# Patient Record
Sex: Male | Born: 1965 | Race: White | Hispanic: No | Marital: Married | Smoking: Never smoker
Health system: Southern US, Community
[De-identification: ages and names within clinical notes are randomized; demographics above are authoritative.]

## PROBLEM LIST (undated history)

## (undated) DIAGNOSIS — M109 Gout, unspecified: Secondary | ICD-10-CM

## (undated) HISTORY — DX: Gout, unspecified: M10.9

---

## 2003-02-26 ENCOUNTER — Emergency Department (HOSPITAL_COMMUNITY): Admission: EM | Admit: 2003-02-26 | Discharge: 2003-02-26 | Payer: Self-pay | Admitting: Emergency Medicine

## 2008-09-05 ENCOUNTER — Emergency Department (HOSPITAL_COMMUNITY): Admission: EM | Admit: 2008-09-05 | Discharge: 2008-09-05 | Payer: Self-pay | Admitting: Family Medicine

## 2013-05-01 ENCOUNTER — Encounter: Payer: Self-pay | Admitting: Family Medicine

## 2013-05-01 ENCOUNTER — Ambulatory Visit (INDEPENDENT_AMBULATORY_CARE_PROVIDER_SITE_OTHER): Payer: BC Managed Care – PPO | Admitting: Family Medicine

## 2013-05-01 VITALS — BP 141/88 | HR 71 | Temp 99.4°F | Ht 69.0 in | Wt 227.0 lb

## 2013-05-01 DIAGNOSIS — R3915 Urgency of urination: Secondary | ICD-10-CM

## 2013-05-01 DIAGNOSIS — R300 Dysuria: Secondary | ICD-10-CM

## 2013-05-01 DIAGNOSIS — R350 Frequency of micturition: Secondary | ICD-10-CM

## 2013-05-01 LAB — POCT URINALYSIS DIPSTICK
Bilirubin, UA: NEGATIVE
Blood, UA: NEGATIVE
Glucose, UA: NEGATIVE
Ketones, UA: NEGATIVE
Leukocytes, UA: NEGATIVE
Nitrite, UA: NEGATIVE
Protein, UA: NEGATIVE
Spec Grav, UA: 1.015
Urobilinogen, UA: NEGATIVE
pH, UA: 6

## 2013-05-01 MED ORDER — FLUCONAZOLE 150 MG PO TABS: 150.0000 mg | ORAL_TABLET | Freq: Once | ORAL | Status: DC

## 2013-05-01 NOTE — Patient Instructions (Addendum)
Urinary Tract Infection  Urinary tract infections (UTIs) can develop anywhere along your urinary tract. Your urinary tract is your body's drainage system for removing wastes and extra water. Your urinary tract includes two kidneys, two ureters, a bladder, and a urethra. Your kidneys are a pair of bean-shaped organs. Each kidney is about the size of your fist. They are located below your ribs, one on each side of your spine.  CAUSES  Infections are caused by microbes, which are microscopic organisms, including fungi, viruses, and bacteria. These organisms are so small that they can only be seen through a microscope. Bacteria are the microbes that most commonly cause UTIs.  SYMPTOMS   Symptoms of UTIs may vary by age and gender of the patient and by the location of the infection. Symptoms in young women typically include a frequent and intense urge to urinate and a painful, burning feeling in the bladder or urethra during urination. Older women and men are more likely to be tired, shaky, and weak and have muscle aches and abdominal pain. A fever may mean the infection is in your kidneys. Other symptoms of a kidney infection include pain in your back or sides below the ribs, nausea, and vomiting.  DIAGNOSIS  To diagnose a UTI, your caregiver will ask you about your symptoms. Your caregiver also will ask to provide a urine sample. The urine sample will be tested for bacteria and white blood cells. White blood cells are made by your body to help fight infection.  TREATMENT   Typically, UTIs can be treated with medication. Because most UTIs are caused by a bacterial infection, they usually can be treated with the use of antibiotics. The choice of antibiotic and length of treatment depend on your symptoms and the type of bacteria causing your infection.  HOME CARE INSTRUCTIONS   If you were prescribed antibiotics, take them exactly as your caregiver instructs you. Finish the medication even if you feel better after you  have only taken some of the medication.   Drink enough water and fluids to keep your urine clear or pale yellow.   Avoid caffeine, tea, and carbonated beverages. They tend to irritate your bladder.   Empty your bladder often. Avoid holding urine for long periods of time.   Empty your bladder before and after sexual intercourse.   After a bowel movement, women should cleanse from front to back. Use each tissue only once.  SEEK MEDICAL CARE IF:    You have back pain.   You develop a fever.   Your symptoms do not begin to resolve within 3 days.  SEEK IMMEDIATE MEDICAL CARE IF:    You have severe back pain or lower abdominal pain.   You develop chills.   You have nausea or vomiting.   You have continued burning or discomfort with urination.  MAKE SURE YOU:    Understand these instructions.   Will watch your condition.   Will get help right away if you are not doing well or get worse.  Document Released: 05/02/2005 Document Revised: 01/22/2012 Document Reviewed: 08/31/2011  [employer] Patient Information 2014 [employer].

## 2013-05-01 NOTE — Progress Notes (Signed)
  Subjective:    Patient ID: Christopher Gilmore, male    DOB: 1965-11-27, 47 y.o.   MRN: [id number]  HPI This 47 y.o. male presents for evaluation of urethritis for 2 days.  He states he has Had similar problem when he has sex with his wife and she gets a yeast infection And it transfers to him.  He has been having some dysuria.   Review of Systems No chest pain, SOB, HA, dizziness, vision change, N/V, diarrhea, constipation, dysuria, urinary urgency or frequency, myalgias, arthralgias or rash.     Objective:   Physical Exam Vital signs noted  Well developed well nourished male.  HEENT - Head atraumatic Normocephalic                Eyes - PERRLA, Conjuctiva - clear Sclera- Clear EOMI                Ears - EAC's Wnl TM's Wnl Gross Hearing WNL                Nose - Nares patent                 Throat - oropharanx wnl Respiratory - Lungs CTA bilateral Cardiac - RRR S1 and S2 without murmur GI - Abdomen soft Nontender and bowel sounds active x 4 GU - No balanitis seen and urethra appears wnl Extremities - No edema. Neuro - Grossly intact.  Results for orders placed in visit on 05/01/13  POCT URINALYSIS DIPSTICK      Result Value Range   Color, UA gold     Clarity, UA clear     Glucose, UA negative     Bilirubin, UA negative     Ketones, UA negative     Spec Grav, UA 1.015     Blood, UA negative     pH, UA 6.0     Protein, UA negative     Urobilinogen, UA negative     Nitrite, UA negative     Leukocytes, UA Negative         Assessment & Plan:  Urinary frequency - Plan: POCT urinalysis dipstick, POCT UA - Microscopic Only, Urine culture  Dysuria - Plan: POCT urinalysis dipstick, POCT UA - Microscopic Only, Urine culture Probably due to balanitis  Urgency of urination - Plan: POCT urinalysis dipstick, POCT UA - Microscopic Only, Urine culture  Followup prn if not better.

## 2013-05-05 ENCOUNTER — Other Ambulatory Visit: Payer: Self-pay | Admitting: Family Medicine

## 2013-05-05 DIAGNOSIS — N390 Urinary tract infection, site not specified: Secondary | ICD-10-CM

## 2013-05-05 LAB — URINE CULTURE

## 2013-05-05 MED ORDER — CIPROFLOXACIN HCL 500 MG PO TABS: 500.0000 mg | ORAL_TABLET | Freq: Two times a day (BID) | ORAL | Status: DC

## 2013-05-11 NOTE — Progress Notes (Signed)
Pt aware, going to pickup rx at [facility]

## 2013-08-04 ENCOUNTER — Encounter: Payer: Self-pay | Admitting: Family Medicine

## 2013-08-04 ENCOUNTER — Telehealth: Payer: Self-pay | Admitting: Family Medicine

## 2013-08-04 ENCOUNTER — Ambulatory Visit (INDEPENDENT_AMBULATORY_CARE_PROVIDER_SITE_OTHER): Payer: BC Managed Care – PPO | Admitting: Family Medicine

## 2013-08-04 VITALS — BP 146/87 | HR 96 | Temp 101.1°F | Ht 69.0 in | Wt 223.8 lb

## 2013-08-04 DIAGNOSIS — J111 Influenza due to unidentified influenza virus with other respiratory manifestations: Secondary | ICD-10-CM

## 2013-08-04 MED ORDER — OSELTAMIVIR PHOSPHATE 75 MG PO CAPS: 75.0000 mg | ORAL_CAPSULE | Freq: Two times a day (BID) | ORAL | Status: DC

## 2013-08-04 MED ORDER — HYDROCODONE-ACETAMINOPHEN 5-325 MG PO TABS: ORAL_TABLET | ORAL | Status: DC

## 2013-08-04 NOTE — Patient Instructions (Signed)

## 2013-08-04 NOTE — Telephone Encounter (Signed)
APPT TODAY AT 5:15

## 2013-08-04 NOTE — Progress Notes (Signed)
   Subjective:    Patient ID: Christopher Gilmore, male    DOB: August 26, 1965, 47 y.o.   MRN: [id number]  HPI This 47 y.o. male presents for evaluation of flu symptoms for a day.  He is having fever and  He is having pain in his back, legs, arms and chest muscles.  He feels very tired.  He is having High fever.   Review of Systems No chest pain, SOB, HA, dizziness, vision change, N/V, diarrhea, constipation, dysuria, urinary urgency or frequency, myalgias, arthralgias or rash.     Objective:   Physical Exam  Vital signs noted  Acutely ill appearing male.  HEENT - Head atraumatic Normocephalic                Eyes - PERRLA, Conjuctiva - clear Sclera- Clear EOMI                Ears - EAC's Wnl TM's Wnl Gross Hearing WNL                Throat - oropharanx wnl Respiratory - Lungs CTA bilateral Cardiac - RRR S1 and S2 without murmur GI - Abdomen soft Nontender and bowel sounds active x 4 Extremities - No edema. Neuro - Grossly intact.      Assessment & Plan:  Influenza - Plan: HYDROcodone-acetaminophen (NORCO) 5-325 MG per tablet, oseltamivir (TAMIFLU) 75 MG capsule, DISCONTINUED: oseltamivir (TAMIFLU) 75 MG capsule  Push po fluids, rest, tylenol and motrin otc prn as directed for fever, arthralgias, and myalgias.  Follow up prn if sx's continue or persist.  Deatra Canter FNP

## 2013-08-08 ENCOUNTER — Ambulatory Visit (INDEPENDENT_AMBULATORY_CARE_PROVIDER_SITE_OTHER): Payer: BC Managed Care – PPO | Admitting: Family Medicine

## 2013-08-08 ENCOUNTER — Encounter (INDEPENDENT_AMBULATORY_CARE_PROVIDER_SITE_OTHER): Payer: Self-pay

## 2013-08-08 ENCOUNTER — Encounter: Payer: Self-pay | Admitting: Family Medicine

## 2013-08-08 VITALS — BP 131/85 | HR 101 | Temp 102.8°F | Ht 69.0 in | Wt 221.0 lb

## 2013-08-08 DIAGNOSIS — N419 Inflammatory disease of prostate, unspecified: Secondary | ICD-10-CM

## 2013-08-08 DIAGNOSIS — R509 Fever, unspecified: Secondary | ICD-10-CM

## 2013-08-08 DIAGNOSIS — N390 Urinary tract infection, site not specified: Secondary | ICD-10-CM

## 2013-08-08 LAB — POCT URINALYSIS DIPSTICK
Bilirubin, UA: NEGATIVE
GLUCOSE UA: NEGATIVE
KETONES UA: NEGATIVE
Nitrite, UA: NEGATIVE
SPEC GRAV UA: 1.01
UROBILINOGEN UA: NEGATIVE
pH, UA: 5

## 2013-08-08 LAB — POCT UA - MICROSCOPIC ONLY
CASTS, UR, LPF, POC: NEGATIVE
CRYSTALS, UR, HPF, POC: NEGATIVE
Casts, Ur, LPF, POC: NEGATIVE
Crystals, Ur, HPF, POC: NEGATIVE
MUCUS UA: NEGATIVE
Mucus, UA: NEGATIVE
Yeast, UA: NEGATIVE
Yeast, UA: NEGATIVE

## 2013-08-08 LAB — POCT CBC
GRANULOCYTE PERCENT: 88.2 % — AB (ref 37–80)
HEMATOCRIT: 45.9 % (ref 43.5–53.7)
HEMOGLOBIN: 14.4 g/dL (ref 14.1–18.1)
Lymph, poc: 1.7 (ref 0.6–3.4)
MCH: 27.3 pg (ref 27–31.2)
MCHC: 31.5 g/dL — AB (ref 31.8–35.4)
MCV: 86.8 fL (ref 80–97)
MPV: 7.3 fL (ref 0–99.8)
PLATELET COUNT, POC: 220 10*3/uL (ref 142–424)
POC Granulocyte: 14 — AB (ref 2–6.9)
POC LYMPH PERCENT: 10.5 %L (ref 10–50)
RBC: 5.3 M/uL (ref 4.69–6.13)
RDW, POC: 12.3 %
WBC: 15.9 10*3/uL — AB (ref 4.6–10.2)

## 2013-08-08 MED ORDER — SULFAMETHOXAZOLE-TMP DS 800-160 MG PO TABS: 1.0000 | ORAL_TABLET | Freq: Two times a day (BID) | ORAL | Status: DC

## 2013-08-08 MED ORDER — IBUPROFEN 200 MG PO TABS
600.0000 mg | ORAL_TABLET | Freq: Once | ORAL | Status: AC
  Administered 2013-08-08: 600 mg via ORAL

## 2013-08-08 NOTE — Progress Notes (Signed)
Subjective:    Patient ID: Christopher Gilmore, male    DOB: October 03, 1965, 48 y.o.   MRN: [id number]  HPI 3 day history of dysuria and one week history of fever. Treated with Tamiflu last week. Denies any cough, congestion, or sore throat. Main complaint at previous visit was fever.    Review of Systems  Constitutional: Positive for fever, activity change and fatigue.  HENT: Negative.   Eyes: Negative.   Respiratory: Negative.   Cardiovascular: Negative.   Gastrointestinal: Negative.   Endocrine: Negative.   Genitourinary: Positive for dysuria, frequency and flank pain (left flank). Negative for testicular pain.  Musculoskeletal: Negative.   Skin: Negative.   Allergic/Immunologic: Negative.   Neurological: Negative.   Hematological: Negative.   Psychiatric/Behavioral: Negative.        Objective:   Physical Exam  Nursing note and vitals reviewed. Constitutional: He is oriented to person, place, and time. He appears well-developed and well-nourished. He appears distressed (just feeling bad from the fever).  HENT:  Head: Normocephalic and atraumatic.  Right Ear: External ear normal.  Left Ear: External ear normal.  Nose: Nose normal.  Mouth/Throat: Oropharynx is clear and moist. No oropharyngeal exudate.  Eyes: Conjunctivae and EOM are normal. Pupils are equal, round, and reactive to light. Right eye exhibits no discharge. Left eye exhibits no discharge. No scleral icterus.  Neck: Normal range of motion. Neck supple. No thyromegaly present.  Cardiovascular: Normal rate, regular rhythm, normal heart sounds and intact distal pulses.  Exam reveals no gallop and no friction rub.   No murmur heard. At 96 per minute  Pulmonary/Chest: Effort normal and breath sounds normal. No respiratory distress. He has no wheezes. He has no rales. He exhibits no tenderness.  Abdominal: Soft. Bowel sounds are normal. He exhibits no mass. There is no tenderness. There is no rebound and no guarding.    Minimal suprapubic and right lower quadrant  Genitourinary: Rectum normal and penis normal.  The prostate gland was slightly enlarged and tender to palpation. There was some discomfort with examining the prostate. The left testicle is in the scrotum but sits higher than the right testicle and this is normal for this patient. There was no super testicular masses and there was no hernia palpated on either side.  Musculoskeletal: Normal range of motion.  Lymphadenopathy:    He has no cervical adenopathy.  Neurological: He is alert and oriented to person, place, and time. He has normal reflexes.  Skin: Skin is warm and dry. No rash noted. No erythema. No pallor.  Psychiatric: He has a normal mood and affect. His behavior is normal. Judgment and thought content normal.    Results for orders placed in visit on 08/08/13  POCT CBC      Result Value Range   WBC 15.9 (*) 4.6 - 10.2 K/uL   Lymph, poc 1.7  0.6 - 3.4   POC LYMPH PERCENT 10.5  10 - 50 %L   POC Granulocyte 14.0 (*) 2 - 6.9   Granulocyte percent 88.2 (*) 37 - 80 %G   RBC 5.3  4.69 - 6.13 M/uL   Hemoglobin 14.4  14.1 - 18.1 g/dL   HCT, POC 21.3  08.6 - 53.7 %   MCV 86.8  80 - 97 fL   MCH, POC 27.3  27 - 31.2 pg   MCHC 31.5 (*) 31.8 - 35.4 g/dL   RDW, POC 57.8     Platelet Count, POC 220.0  142 - 424 K/uL  MPV 7.3  0 - 99.8 fL  POCT UA - MICROSCOPIC ONLY      Result Value Range   WBC, Ur, HPF, POC tntc     RBC, urine, microscopic 5-8     Bacteria, U Microscopic many     Mucus, UA negative     Epithelial cells, urine per micros occ     Crystals, Ur, HPF, POC negative     Casts, Ur, LPF, POC negative     Yeast, UA negative    POCT URINALYSIS DIPSTICK      Result Value Range   Color, UA gold     Clarity, UA cloudy     Glucose, UA negative     Bilirubin, UA negative     Ketones, UA negative     Spec Grav, UA 1.010     Blood, UA large     pH, UA 5.0     Protein, UA 4+     Urobilinogen, UA negative     Nitrite, UA  negative     Leukocytes, UA large (3+)           Assessment & Plan:  1. Fever, unspecified - POCT CBC - POCT UA - Microscopic Only - POCT urinalysis dipstick - Urine culture - ibuprofen (ADVIL,MOTRIN) tablet 600 mg; Take 3 tablets (600 mg total) by mouth once.  2. Urinary tract infection, site not specified - POCT CBC - POCT UA - Microscopic Only - POCT urinalysis dipstick - Urine culture - POCT UA - Microscopic Only; Future - POCT urinalysis dipstick; Future  3. Prostatitis Patient Instructions  Drink plenty of fluids Take Tylenol alternating with Advil for aches pains and fever Avoid milk cheese ice cream and dairy products and caffeine Take medication as directed If pain in the right lower quadrant gets more severe he should go to the emergency room for further evaluation Repeat urinalysis next Tuesday Remain out of work through Tuesday   Nyra Capeson W. Moore MD

## 2013-08-08 NOTE — Patient Instructions (Addendum)
Drink plenty of fluids Take Tylenol alternating with Advil for aches pains and fever Avoid milk cheese ice cream and dairy products and caffeine Take medication as directed If pain in the right lower quadrant gets more severe he should go to the emergency room for further evaluation Repeat urinalysis next Tuesday Remain out of work through Tuesday

## 2013-08-11 ENCOUNTER — Other Ambulatory Visit (INDEPENDENT_AMBULATORY_CARE_PROVIDER_SITE_OTHER): Payer: BC Managed Care – PPO

## 2013-08-11 DIAGNOSIS — N390 Urinary tract infection, site not specified: Secondary | ICD-10-CM

## 2013-08-11 LAB — POCT UA - MICROSCOPIC ONLY
Bacteria, U Microscopic: NEGATIVE
CRYSTALS, UR, HPF, POC: NEGATIVE
Casts, Ur, LPF, POC: NEGATIVE
Yeast, UA: NEGATIVE

## 2013-08-11 LAB — POCT URINALYSIS DIPSTICK
BILIRUBIN UA: NEGATIVE
Glucose, UA: NEGATIVE
KETONES UA: NEGATIVE
Nitrite, UA: NEGATIVE
PH UA: 5
PROTEIN UA: NEGATIVE
SPEC GRAV UA: 1.01
Urobilinogen, UA: NEGATIVE

## 2013-08-11 LAB — URINE CULTURE

## 2013-08-11 NOTE — Addendum Note (Signed)
Addended by: Prescott GumLAND, Junah Yam M on: 08/11/2013 11:29 AM   Modules accepted: Orders

## 2013-08-11 NOTE — Progress Notes (Signed)
Pt came in for labs only 

## 2013-08-12 ENCOUNTER — Telehealth: Payer: Self-pay | Admitting: Family Medicine

## 2013-08-12 LAB — URINE CULTURE: Organism ID, Bacteria: NO GROWTH

## 2013-08-25 NOTE — Telephone Encounter (Signed)
Called and left message for patient.

## 2013-08-26 NOTE — Telephone Encounter (Signed)
Done 08/12/13 by Asher Muirjamie b

## 2014-12-01 ENCOUNTER — Ambulatory Visit (INDEPENDENT_AMBULATORY_CARE_PROVIDER_SITE_OTHER): Payer: BLUE CROSS/BLUE SHIELD | Admitting: Family Medicine

## 2014-12-01 ENCOUNTER — Encounter: Payer: Self-pay | Admitting: Family Medicine

## 2014-12-01 VITALS — BP 137/86 | HR 66 | Temp 97.8°F | Ht 69.0 in | Wt 225.4 lb

## 2014-12-01 DIAGNOSIS — M25571 Pain in right ankle and joints of right foot: Secondary | ICD-10-CM

## 2014-12-01 MED ORDER — INDOMETHACIN 25 MG PO CAPS: 25.0000 mg | ORAL_CAPSULE | Freq: Three times a day (TID) | ORAL | Status: DC | PRN

## 2014-12-01 NOTE — Patient Instructions (Signed)
Due to your reluctance to use allopurinol and the infrequency of your symptoms let's use an observation status for the gout. If your symptoms become more frequent then we can consider allopurinol and/or the newer Uloric for gout prevention. In the meantime you can take the indomethacin up to 3 times a day with food to relieve pain.

## 2014-12-01 NOTE — Progress Notes (Signed)
Subjective:  Patient ID: JABARIE POP, male    DOB: [birth year]  Age: 49 y.o. MRN: [id number]  CC: Gout   HPI Linell C Kirchner presents for onset 3 days ago of moderately severe pain at the base of the right first toe. This has gone from about a 7-8/10 to a 2-3/10 today. He has no previous history of gout. He declines blood testing today to determine uric acid level and presence of gout.  History Majed has no past medical history on file.   He has no past surgical history on file.   His family history includes Cancer in his mother.He reports that he has never smoked. He does not have any smokeless tobacco history on file. He reports that he drinks alcohol. He reports that he does not use illicit drugs.  No current outpatient prescriptions on file prior to visit.   No current facility-administered medications on file prior to visit.    ROS Review of Systems  Constitutional: Negative for fever, chills, diaphoresis and unexpected weight change.  HENT: Negative for congestion, hearing loss, rhinorrhea, sore throat and trouble swallowing.   Respiratory: Negative for cough, chest tightness, shortness of breath and wheezing.   Gastrointestinal: Negative for nausea, vomiting, abdominal pain, diarrhea, constipation and abdominal distention.  Endocrine: Negative for cold intolerance and heat intolerance.  Genitourinary: Negative for dysuria, hematuria and flank pain.  Musculoskeletal: Positive for joint swelling and arthralgias (right first toe. See history of present illness none others noted).  Skin: Negative for rash.  Neurological: Negative for dizziness and headaches.  Psychiatric/Behavioral: Negative for dysphoric mood, decreased concentration and agitation. The patient is not nervous/anxious.     Objective:  BP 137/86 mmHg  Pulse 66  Temp(Src) 97.8 F (36.6 C) (Oral)  Ht  (1.753 m)  Wt 225 lb 6.4 oz (102.241 kg)  BMI 33.27 kg/m2  BP Readings from Last 3 Encounters:    12/01/14 137/86  08/08/13 131/85  08/04/13 146/87    Wt Readings from Last 3 Encounters:  12/01/14 225 lb 6.4 oz (102.241 kg)  08/08/13 221 lb (100.245 kg)  08/04/13 223 lb 12.8 oz (101.515 kg)     Physical Exam  Constitutional: He is oriented to person, place, and time. He appears well-developed and well-nourished. No distress.  HENT:  Head: Normocephalic and atraumatic.  Right Ear: External ear normal.  Left Ear: External ear normal.  Nose: Nose normal.  Mouth/Throat: Oropharynx is clear and moist.  Eyes: Conjunctivae and EOM are normal. Pupils are equal, round, and reactive to light.  Neck: Normal range of motion. Neck supple. No thyromegaly present.  Cardiovascular: Normal rate, regular rhythm and normal heart sounds.   No murmur heard. Pulmonary/Chest: Effort normal and breath sounds normal. No respiratory distress. He has no wheezes. He has no rales.  Abdominal: Soft. Bowel sounds are normal. He exhibits no distension. There is tenderness (right first MTP jointMild-to-moderate).  Lymphadenopathy:    He has no cervical adenopathy.  Neurological: He is alert and oriented to person, place, and time. He has normal reflexes.  Skin: Skin is warm and dry.  Psychiatric: He has a normal mood and affect. His behavior is normal. Judgment and thought content normal.    No results found for: HGBA1C  Lab Results  Component Value Date   WBC 15.9* 08/08/2013   HGB 14.4 08/08/2013   HCT 45.9 08/08/2013    Dg Ankle Complete Right  09/05/2008   Clinical Data: 49 year old male fall, ankle pain and swelling  RIGHT ANKLE - COMPLETE 3+ VIEW   Comparison: None.   Findings: The right distal fibula shaft demonstrate an acute oblique fracture with minimal displacement.  At the ankle, there is diffuse soft tissue swelling.  Medially, there is widening of the joint space with small avulsion fractures noted of the medial malleolus.  On the lateral view, a subtle nondisplaced fracture is  suspected of the posterior malleolus.  Talus and calcaneus appear intact.   IMPRESSION: Acute displaced right distal fibula shaft fracture. Small avulsion fractures of the medial malleolus with widening of the ankle joint space Possible nondisplaced fracture of the posterior malleolus on the lateral view.  Provider: Lianne MorisJennifer Lentz   Assessment & Plan:   Regino BellowLoman was seen today for gout.  Diagnoses and all orders for this visit:  Arthralgia of toe of right foot Orders: -     Uric acid -     POCT CBC  Other orders -     indomethacin (INDOCIN) 25 MG capsule; Take 1 capsule (25 mg total) by mouth 3 (three) times daily with meals as needed. For foot pain   I have discontinued Mr. Jaquita FoldsHanes's HYDROcodone-acetaminophen, oseltamivir, and sulfamethoxazole-trimethoprim. I am also having him start on indomethacin.  Meds ordered this encounter  Medications  . indomethacin (INDOCIN) 25 MG capsule    Sig: Take 1 capsule (25 mg total) by mouth 3 (three) times daily with meals as needed. For foot pain    Dispense:  30 capsule    Refill:  2   Due to your reluctance to use allopurinol and the infrequency of your symptoms let's use an observation status for the gout. If your symptoms become more frequent then we can consider allopurinol and/or the newer Uloric for gout prevention. In the meantime you can take the indomethacin up to 3 times a day with food to relieve pain.  Follow-up: Return if symptoms worsen or fail to improve, for CPE recommended.  Mechele ClaudeWarren Charlita Brian, M.D.

## 2015-11-20 DIAGNOSIS — I10 Essential (primary) hypertension: Secondary | ICD-10-CM | POA: Diagnosis not present

## 2015-11-21 ENCOUNTER — Ambulatory Visit (INDEPENDENT_AMBULATORY_CARE_PROVIDER_SITE_OTHER): Payer: BLUE CROSS/BLUE SHIELD | Admitting: Family Medicine

## 2015-11-21 ENCOUNTER — Encounter: Payer: Self-pay | Admitting: Family Medicine

## 2015-11-21 VITALS — BP 157/97 | HR 63 | Temp 98.0°F | Ht 69.0 in | Wt 227.4 lb

## 2015-11-21 DIAGNOSIS — M109 Gout, unspecified: Secondary | ICD-10-CM

## 2015-11-21 DIAGNOSIS — IMO0001 Reserved for inherently not codable concepts without codable children: Secondary | ICD-10-CM

## 2015-11-21 DIAGNOSIS — R030 Elevated blood-pressure reading, without diagnosis of hypertension: Secondary | ICD-10-CM | POA: Diagnosis not present

## 2015-11-21 DIAGNOSIS — M10072 Idiopathic gout, left ankle and foot: Secondary | ICD-10-CM | POA: Diagnosis not present

## 2015-11-21 MED ORDER — PREDNISONE 20 MG PO TABS
ORAL_TABLET | ORAL | Status: DC
Start: 2015-11-21 — End: 2016-05-24

## 2015-11-21 NOTE — Progress Notes (Signed)
BP 157/97 mmHg  Pulse 63  Temp(Src) 98 F (36.7 C) (Oral)  Ht  (1.753 m)  Wt 227 lb 6.4 oz (103.148 kg)  BMI 33.57 kg/m2   Subjective:    Patient ID: Christopher Gilmore, male    DOB: 1966/03/22, 50 y.o.   MRN: [id number]  HPI: Christopher Gilmore is a 50 y.o. male presenting on 11/21/2015 for Left foot pain and Hypertension   HPI Left foot pain Patient has had left foot pain and swelling for the past 4 days. Yesterday he went into an urgent care and was seen there because of the pain and how it had increased. He had been taking some leftover Indocin that he had from previous. He does get known gout flares and that is the location that he always gets it in. The pain is worse around the MTP joint of the second and third toe. He denies any fevers or chills. There has been swelling and some redness around the toe but those have greatly reduced today.  Hypertension Patient's blood pressure is elevated today and was elevated yesterday in the urgent care that he went to. He is in pain from both of those occasions and he says it has not been elevated as much previously. Patient denies headaches, blurred vision, chest pains, shortness of breath, or weakness. He is not currently on any medications for blood pressure.   Relevant past medical, surgical, family and social history reviewed and updated as indicated. Interim medical history since our last visit reviewed. Allergies and medications reviewed and updated.  Review of Systems  Constitutional: Negative for fever.  HENT: Negative for ear discharge and ear pain.   Eyes: Negative for discharge and visual disturbance.  Respiratory: Negative for shortness of breath and wheezing.   Cardiovascular: Negative for chest pain and leg swelling.  Gastrointestinal: Negative for abdominal pain, diarrhea and constipation.  Genitourinary: Negative for difficulty urinating.  Musculoskeletal: Positive for joint swelling and arthralgias. Negative for back pain  and gait problem.  Skin: Positive for color change. Negative for rash.  Neurological: Negative for syncope, light-headedness and headaches.  All other systems reviewed and are negative.   Per HPI unless specifically indicated above     Medication List       This list is accurate as of: 11/21/15  4:47 PM.  Always use your most recent med list.               indomethacin 25 MG capsule  Commonly known as:  INDOCIN  Take 1 capsule (25 mg total) by mouth 3 (three) times daily with meals as needed. For foot pain     predniSONE 20 MG tablet  Commonly known as:  DELTASONE  2 po at same time daily for 5 days           Objective:    BP 157/97 mmHg  Pulse 63  Temp(Src) 98 F (36.7 C) (Oral)  Ht  (1.753 m)  Wt 227 lb 6.4 oz (103.148 kg)  BMI 33.57 kg/m2  Wt Readings from Last 3 Encounters:  11/21/15 227 lb 6.4 oz (103.148 kg)  12/01/14 225 lb 6.4 oz (102.241 kg)  08/08/13 221 lb (100.245 kg)    Physical Exam  Constitutional: He is oriented to person, place, and time. He appears well-developed and well-nourished. No distress.  Eyes: Conjunctivae and EOM are normal. Pupils are equal, round, and reactive to light. Right eye exhibits no discharge. No scleral icterus.  Cardiovascular: Normal rate,  regular rhythm, normal heart sounds and intact distal pulses.   No murmur heard. Pulmonary/Chest: Effort normal and breath sounds normal. No respiratory distress. He has no wheezes.  Musculoskeletal: Normal range of motion. He exhibits no edema.  Neurological: He is alert and oriented to person, place, and time. Coordination normal.  Skin: Skin is warm and dry. No rash noted. He is not diaphoretic.  Psychiatric: He has a normal mood and affect. His behavior is normal.  Nursing note and vitals reviewed.   Results for orders placed or performed in visit on 08/11/13  Urine culture  Result Value Ref Range   Urine Culture, Routine Final report    Urine Culture result 1 No growth    POCT UA - Microscopic Only  Result Value Ref Range   WBC, Ur, HPF, POC 15-20    RBC, urine, microscopic 1-5    Bacteria, U Microscopic neg    Mucus, UA occ    Epithelial cells, urine per micros occ    Crystals, Ur, HPF, POC neg    Casts, Ur, LPF, POC neg    Yeast, UA neg   POCT urinalysis dipstick  Result Value Ref Range   Color, UA yellow    Clarity, UA clear    Glucose, UA neg    Bilirubin, UA neg    Ketones, UA neg    Spec Grav, UA 1.010    Blood, UA trace    pH, UA 5.0    Protein, UA neg    Urobilinogen, UA negative    Nitrite, UA neg    Leukocytes, UA moderate (2+)       Assessment & Plan:   Problem List Items Addressed This Visit    None    Visit Diagnoses    Acute gout of left foot, unspecified cause    -  Primary    Relevant Medications    predniSONE (DELTASONE) 20 MG tablet    Elevated blood pressure        We will have him check it at home and come back in a month with those numbers and recheck here in the office once his pain is gone.        Follow up plan: Return in about 4 weeks (around 12/19/2015), or if symptoms worsen or fail to improve, for Recheck blood pressure and physical and fasting labs.  Counseling provided for all of the vaccine components No orders of the defined types were placed in this encounter.    Arville CareJoshua Dettinger, MD [facility] 11/21/2015, 4:47 PM

## 2015-11-25 ENCOUNTER — Telehealth: Payer: Self-pay | Admitting: Family Medicine

## 2015-11-25 MED ORDER — COLCHICINE 0.6 MG PO TABS: 0.6000 mg | ORAL_TABLET | Freq: Every day | ORAL | Status: DC

## 2015-11-25 NOTE — Telephone Encounter (Signed)
Sent colchicine for him to try for his gout, may continue taking Indocin or ibuprofen with the colchicine

## 2015-12-02 NOTE — Telephone Encounter (Signed)
Several attempts have been made to contact patient this encounter will be closed.  

## 2015-12-19 ENCOUNTER — Ambulatory Visit: Payer: BLUE CROSS/BLUE SHIELD | Admitting: Family Medicine

## 2015-12-28 ENCOUNTER — Ambulatory Visit: Payer: BLUE CROSS/BLUE SHIELD | Admitting: Family Medicine

## 2016-01-05 ENCOUNTER — Ambulatory Visit: Payer: BLUE CROSS/BLUE SHIELD | Admitting: Family Medicine

## 2016-01-11 ENCOUNTER — Ambulatory Visit: Payer: BLUE CROSS/BLUE SHIELD | Admitting: Family Medicine

## 2016-05-24 ENCOUNTER — Encounter: Payer: Self-pay | Admitting: Family Medicine

## 2016-05-24 ENCOUNTER — Ambulatory Visit (INDEPENDENT_AMBULATORY_CARE_PROVIDER_SITE_OTHER): Payer: BLUE CROSS/BLUE SHIELD | Admitting: Family Medicine

## 2016-05-24 VITALS — BP 154/96 | HR 64 | Temp 97.3°F | Ht 69.0 in | Wt 228.0 lb

## 2016-05-24 DIAGNOSIS — S39013A Strain of muscle, fascia and tendon of pelvis, initial encounter: Secondary | ICD-10-CM | POA: Diagnosis not present

## 2016-05-24 DIAGNOSIS — R030 Elevated blood-pressure reading, without diagnosis of hypertension: Secondary | ICD-10-CM

## 2016-05-24 DIAGNOSIS — S39011A Strain of muscle, fascia and tendon of abdomen, initial encounter: Secondary | ICD-10-CM

## 2016-05-24 NOTE — Progress Notes (Signed)
BP (!) 156/94   Pulse 73   Temp 97.3 F (36.3 C) (Oral)   Ht 5\' 9"  (1.753 m)   Wt 228 lb (103.4 kg)   BMI 33.67 kg/m    Subjective:    Patient ID: Christopher FallsLoman C Bublitz, male    DOB: 05/11/1966, 50 y.o.   MRN: [id number]  HPI: Christopher Gilmore is a 50 y.o. 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 He admits that he has been more stressed this week and then having a lot of more responsibilities at work and family difficulties with some of his children in their changes. He does not know if that's why his blood pressure is up today. Patient denies headaches, blurred vision, chest pains, shortness of breath, or weakness.   Groin pain  Patient has right inner groin pain. He said it started about a month ago when he twisted the wrong way and pulled a muscle in his groin. He says it was getting better after that until 4 days ago when he was getting off his lawnmower and he twisted in such a way that he started having the pains again. Since he was coming in today for the blood pressure he just wanted to have it checked to make sure that all was. He denies any numbness or weakness or difficulty ambulating. He denies any constipation or diarrhea or bulges in his groin. He denies any overlying skin changes. It mainly hurts on the inner aspect of his right upper thigh and does not radiate anywhere else. It hurts more when he steps in a certain way or twists in a certain direction.  Relevant past medical, surgical, family and social history reviewed and updated as indicated. Interim medical history since our last visit reviewed. Allergies and medications reviewed and  updated.  Review of Systems  Constitutional: Negative for chills and fever.  Eyes: Negative for discharge.  Respiratory: Negative for shortness of breath and wheezing.   Cardiovascular: Negative for chest pain and leg swelling.  Musculoskeletal: Positive for myalgias. Negative for back pain and gait problem.  Skin: Negative for rash.  Neurological: Negative for dizziness, speech difficulty, weakness, light-headedness, numbness and headaches.  All other systems reviewed and are negative.   Per HPI unless specifically indicated above     Medication List    as of 05/24/2016  1:53 PM   You have not been prescribed any medications.        Objective:    BP (!) 156/94   Pulse 73   Temp 97.3 F (36.3 C) (Oral)   Ht 5\' 9"  (1.753 m)   Wt 228 lb (103.4 kg)   BMI 33.67 kg/m   Wt Readings from Last 3 Encounters:  05/24/16 228 lb (103.4 kg)  11/21/15 227 lb 6.4 oz (103.1 kg)  12/01/14 225 lb 6.4 oz (102.2 kg)    Physical Exam  Constitutional: He is oriented to person, place, and time. He appears well-developed and well-nourished. No distress.  Eyes: Conjunctivae are normal. Right eye exhibits no discharge. No scleral icterus.  Cardiovascular: Normal rate, regular rhythm, normal heart sounds and intact distal pulses.  No murmur heard. Pulmonary/Chest: Effort normal and breath sounds normal. No respiratory distress. He has no wheezes.  Musculoskeletal: Normal range of motion. He exhibits tenderness (Tenderness on right inner upper thigh. Worse with external rotation and abduction.). He exhibits no edema.       Right hip: He exhibits tenderness. He exhibits normal range of motion, normal strength, no bony tenderness and no swelling.  Neurological: He is alert and oriented to person, place, and time. Coordination normal.  Skin: Skin is warm and dry. No rash noted. He is not diaphoretic.  Psychiatric: He has a normal mood and affect. His behavior is normal.  Nursing note and vitals  reviewed.     Assessment & Plan:   Problem List Items Addressed This Visit    None    Visit Diagnoses    Elevated blood pressure reading    -  Primary   Monitor for this month and have him come back in one month and see where it sat.   Strain of muscle of right groin region       Stretches heating pads and anti-inflammatories are recommended.       Follow up plan: Return in about 4 weeks (around 06/21/2016), or if symptoms worsen or fail to improve, for Blood pressure recheck.  Counseling provided for all of the vaccine components No orders of the defined types were placed in this encounter.   Arville Care, MD [facility] 05/24/2016, 1:53 PM

## 2016-06-25 DIAGNOSIS — I10 Essential (primary) hypertension: Secondary | ICD-10-CM | POA: Diagnosis not present

## 2016-07-23 ENCOUNTER — Other Ambulatory Visit: Payer: Self-pay | Admitting: *Deleted

## 2016-07-23 MED ORDER — COLCHICINE 0.6 MG PO TABS: 0.6000 mg | ORAL_TABLET | Freq: Every day | ORAL | 2 refills | Status: DC

## 2016-07-23 NOTE — Telephone Encounter (Signed)
Allopurinol is not something you take when you have a flare, it can actually make the flare worse if he starts it during a flare. If he has been on it that it is okay to continue taking it during the flare. In that case we can go ahead and send a refill but it is not something that you just take during flares. Recommend high-dose anti-inflammatories such as Aleve or ibuprofen as long as his kidneys are in good shape

## 2016-07-23 NOTE — Telephone Encounter (Signed)
Pt is having a gout flare up. Can allupurinol be called in.

## 2016-07-23 NOTE — Telephone Encounter (Signed)
Pt aware colchicine Rx sent to pharmacy. Also made pt aware that he can take the allupurinol as a preventative on a regular basis after the colchicine

## 2016-07-23 NOTE — Telephone Encounter (Signed)
Pt actually has taken colchicine in the past. He has done the aleve & ibuprofen with no relief, has issues walking when his foot is so swollen

## 2016-08-20 DIAGNOSIS — I10 Essential (primary) hypertension: Secondary | ICD-10-CM | POA: Diagnosis not present

## 2016-08-20 DIAGNOSIS — E669 Obesity, unspecified: Secondary | ICD-10-CM | POA: Diagnosis not present

## 2016-08-20 DIAGNOSIS — Z713 Dietary counseling and surveillance: Secondary | ICD-10-CM | POA: Diagnosis not present

## 2016-09-13 ENCOUNTER — Other Ambulatory Visit: Payer: Self-pay | Admitting: *Deleted

## 2016-09-13 MED ORDER — ALLOPURINOL 100 MG PO TABS: 100.0000 mg | ORAL_TABLET | Freq: Every day | ORAL | 6 refills | Status: DC

## 2016-09-13 NOTE — Telephone Encounter (Signed)
Patient aware of recommendation.  

## 2016-10-15 DIAGNOSIS — E669 Obesity, unspecified: Secondary | ICD-10-CM | POA: Diagnosis not present

## 2016-10-15 DIAGNOSIS — Z713 Dietary counseling and surveillance: Secondary | ICD-10-CM | POA: Diagnosis not present

## 2016-10-15 DIAGNOSIS — I10 Essential (primary) hypertension: Secondary | ICD-10-CM | POA: Diagnosis not present

## 2016-10-29 ENCOUNTER — Ambulatory Visit (INDEPENDENT_AMBULATORY_CARE_PROVIDER_SITE_OTHER): Payer: BLUE CROSS/BLUE SHIELD | Admitting: Family Medicine

## 2016-10-29 ENCOUNTER — Encounter: Payer: Self-pay | Admitting: Family Medicine

## 2016-10-29 DIAGNOSIS — E669 Obesity, unspecified: Secondary | ICD-10-CM | POA: Diagnosis not present

## 2016-10-29 DIAGNOSIS — R7301 Impaired fasting glucose: Secondary | ICD-10-CM | POA: Diagnosis not present

## 2016-10-29 DIAGNOSIS — I10 Essential (primary) hypertension: Secondary | ICD-10-CM

## 2016-10-29 DIAGNOSIS — Z719 Counseling, unspecified: Secondary | ICD-10-CM | POA: Diagnosis not present

## 2016-10-29 DIAGNOSIS — Z6833 Body mass index (BMI) 33.0-33.9, adult: Secondary | ICD-10-CM | POA: Diagnosis not present

## 2016-10-29 DIAGNOSIS — Z008 Encounter for other general examination: Secondary | ICD-10-CM | POA: Diagnosis not present

## 2016-10-29 DIAGNOSIS — E782 Mixed hyperlipidemia: Secondary | ICD-10-CM | POA: Diagnosis not present

## 2016-10-29 DIAGNOSIS — J309 Allergic rhinitis, unspecified: Secondary | ICD-10-CM | POA: Diagnosis not present

## 2016-10-29 MED ORDER — LISINOPRIL 20 MG PO TABS: 20.0000 mg | ORAL_TABLET | Freq: Every day | ORAL | 1 refills | Status: DC

## 2016-10-29 NOTE — Progress Notes (Signed)
   BP (!) 147/98   Pulse 69   Temp 98 F (36.7 C) (Oral)   Ht _0  (1.753 m)   Wt 231 lb (104.8 kg)   BMI 34.11 kg/m    Subjective:    Patient ID: Christopher Gilmore, male    DOB: 06/05/1966, 51 y.o.   MRN: [id number]  HPI: Christopher Gilmore is a 51 y.o. male presenting on 10/29/2016 for Hypertension (BP has been elevated, has a lot of stress at work, worrying about teenage son)   HPI Hypertension Patient is concerned because his blood pressure has been elevated a lot recently. His blood pressure today is 147/98 in the last visit as it was around the same. His blood pressures is been getting at work have been in the 140s up to the 160s. He is not currently on any medication. Patient denies headaches, blurred vision, chest pains, shortness of breath, or weakness.    Relevant past medical, surgical, family and social history reviewed and updated as indicated. Interim medical history since our last visit reviewed. Allergies and medications reviewed and updated.  Review of Systems  Constitutional: Negative for chills and fever.  Respiratory: Negative for shortness of breath and wheezing.   Cardiovascular: Negative for chest pain and leg swelling.  Musculoskeletal: Negative for back pain and gait problem.  Skin: Negative for rash.  Neurological: Negative for dizziness, weakness and headaches.  All other systems reviewed and are negative.   Per HPI unless specifically indicated above     Objective:    BP (!) 147/98   Pulse 69   Temp 98 F (36.7 C) (Oral)   Ht _1  (1.753 m)   Wt 231 lb (104.8 kg)   BMI 34.11 kg/m   Wt Readings from Last 3 Encounters:  10/29/16 231 lb (104.8 kg)  05/24/16 228 lb (103.4 kg)  11/21/15 227 lb 6.4 oz (103.1 kg)    Physical Exam  Constitutional: He is oriented to person, place, and time. He appears well-developed and well-nourished. No distress.  Eyes: Conjunctivae are normal. No scleral icterus.  Neck: Neck supple.  Cardiovascular: Normal rate,  regular rhythm, normal heart sounds and intact distal pulses.   No murmur heard. Pulmonary/Chest: Effort normal and breath sounds normal. No respiratory distress. He has no wheezes.  Musculoskeletal: Normal range of motion. He exhibits no edema.  Lymphadenopathy:    He has no cervical adenopathy.  Neurological: He is alert and oriented to person, place, and time. Coordination normal.  Skin: Skin is warm and dry. No rash noted. He is not diaphoretic.  Psychiatric: He has a normal mood and affect. His behavior is normal.  Nursing note and vitals reviewed.     Assessment & Plan:   Problem List Items Addressed This Visit      Cardiovascular and Mediastinum   Hypertension   Relevant Medications   lisinopril (PRINIVIL,ZESTRIL) 20 MG tablet   Other Relevant Orders   CMP14+EGFR   Lipid panel       Follow up plan: Return in about 4 weeks (around 11/26/2016), or if symptoms worsen or fail to improve, for Hypertension recheck.  Counseling provided for all of the vaccine components Orders Placed This Encounter  Procedures  . CMP14+EGFR  . Lipid panel    Caryl Pina, MD [facility] 10/29/2016, 4:50 PM

## 2016-11-12 DIAGNOSIS — Z6832 Body mass index (BMI) 32.0-32.9, adult: Secondary | ICD-10-CM | POA: Diagnosis not present

## 2016-11-12 DIAGNOSIS — F17220 Nicotine dependence, chewing tobacco, uncomplicated: Secondary | ICD-10-CM | POA: Diagnosis not present

## 2016-11-12 DIAGNOSIS — Z719 Counseling, unspecified: Secondary | ICD-10-CM | POA: Diagnosis not present

## 2016-11-12 DIAGNOSIS — E782 Mixed hyperlipidemia: Secondary | ICD-10-CM | POA: Diagnosis not present

## 2016-11-12 DIAGNOSIS — I10 Essential (primary) hypertension: Secondary | ICD-10-CM | POA: Diagnosis not present

## 2016-11-12 DIAGNOSIS — Z008 Encounter for other general examination: Secondary | ICD-10-CM | POA: Diagnosis not present

## 2016-11-12 DIAGNOSIS — E669 Obesity, unspecified: Secondary | ICD-10-CM | POA: Diagnosis not present

## 2016-11-12 DIAGNOSIS — J309 Allergic rhinitis, unspecified: Secondary | ICD-10-CM | POA: Diagnosis not present

## 2016-11-28 ENCOUNTER — Other Ambulatory Visit: Payer: Self-pay | Admitting: Family Medicine

## 2016-11-28 DIAGNOSIS — I10 Essential (primary) hypertension: Secondary | ICD-10-CM

## 2016-12-17 DIAGNOSIS — I10 Essential (primary) hypertension: Secondary | ICD-10-CM | POA: Diagnosis not present

## 2016-12-17 DIAGNOSIS — Z713 Dietary counseling and surveillance: Secondary | ICD-10-CM | POA: Diagnosis not present

## 2016-12-17 DIAGNOSIS — E669 Obesity, unspecified: Secondary | ICD-10-CM | POA: Diagnosis not present

## 2017-02-20 DIAGNOSIS — J309 Allergic rhinitis, unspecified: Secondary | ICD-10-CM | POA: Diagnosis not present

## 2017-02-20 DIAGNOSIS — E782 Mixed hyperlipidemia: Secondary | ICD-10-CM | POA: Diagnosis not present

## 2017-02-20 DIAGNOSIS — F17220 Nicotine dependence, chewing tobacco, uncomplicated: Secondary | ICD-10-CM | POA: Diagnosis not present

## 2017-02-20 DIAGNOSIS — I10 Essential (primary) hypertension: Secondary | ICD-10-CM | POA: Diagnosis not present

## 2017-02-20 DIAGNOSIS — Z008 Encounter for other general examination: Secondary | ICD-10-CM | POA: Diagnosis not present

## 2017-02-20 DIAGNOSIS — E669 Obesity, unspecified: Secondary | ICD-10-CM | POA: Diagnosis not present

## 2017-02-20 DIAGNOSIS — Z6832 Body mass index (BMI) 32.0-32.9, adult: Secondary | ICD-10-CM | POA: Diagnosis not present

## 2017-02-20 DIAGNOSIS — Z716 Tobacco abuse counseling: Secondary | ICD-10-CM | POA: Diagnosis not present

## 2017-02-20 DIAGNOSIS — R7301 Impaired fasting glucose: Secondary | ICD-10-CM | POA: Diagnosis not present

## 2017-03-09 ENCOUNTER — Telehealth: Payer: BLUE CROSS/BLUE SHIELD | Admitting: Nurse Practitioner

## 2017-03-09 DIAGNOSIS — N3000 Acute cystitis without hematuria: Secondary | ICD-10-CM

## 2017-03-09 MED ORDER — CIPROFLOXACIN HCL 500 MG PO TABS: 500.0000 mg | ORAL_TABLET | Freq: Two times a day (BID) | ORAL | 0 refills | Status: DC

## 2017-03-09 NOTE — Progress Notes (Signed)

## 2017-05-20 DIAGNOSIS — I10 Essential (primary) hypertension: Secondary | ICD-10-CM | POA: Diagnosis not present

## 2017-05-20 DIAGNOSIS — Z716 Tobacco abuse counseling: Secondary | ICD-10-CM | POA: Diagnosis not present

## 2017-05-20 DIAGNOSIS — R7301 Impaired fasting glucose: Secondary | ICD-10-CM | POA: Diagnosis not present

## 2017-05-20 DIAGNOSIS — F17220 Nicotine dependence, chewing tobacco, uncomplicated: Secondary | ICD-10-CM | POA: Diagnosis not present

## 2017-05-20 DIAGNOSIS — Z008 Encounter for other general examination: Secondary | ICD-10-CM | POA: Diagnosis not present

## 2017-05-20 DIAGNOSIS — E782 Mixed hyperlipidemia: Secondary | ICD-10-CM | POA: Diagnosis not present

## 2017-05-20 DIAGNOSIS — Z719 Counseling, unspecified: Secondary | ICD-10-CM | POA: Diagnosis not present

## 2017-05-20 DIAGNOSIS — J309 Allergic rhinitis, unspecified: Secondary | ICD-10-CM | POA: Diagnosis not present

## 2017-05-20 DIAGNOSIS — E669 Obesity, unspecified: Secondary | ICD-10-CM | POA: Diagnosis not present

## 2017-05-27 DIAGNOSIS — I10 Essential (primary) hypertension: Secondary | ICD-10-CM | POA: Diagnosis not present

## 2017-06-12 DIAGNOSIS — Z79899 Other long term (current) drug therapy: Secondary | ICD-10-CM | POA: Diagnosis not present

## 2017-06-12 DIAGNOSIS — I10 Essential (primary) hypertension: Secondary | ICD-10-CM | POA: Diagnosis not present

## 2017-06-26 DIAGNOSIS — R945 Abnormal results of liver function studies: Secondary | ICD-10-CM | POA: Diagnosis not present

## 2017-06-26 DIAGNOSIS — I10 Essential (primary) hypertension: Secondary | ICD-10-CM | POA: Diagnosis not present

## 2017-06-26 DIAGNOSIS — J0100 Acute maxillary sinusitis, unspecified: Secondary | ICD-10-CM | POA: Diagnosis not present

## 2017-07-03 DIAGNOSIS — J0101 Acute recurrent maxillary sinusitis: Secondary | ICD-10-CM | POA: Diagnosis not present

## 2017-10-14 DIAGNOSIS — F17220 Nicotine dependence, chewing tobacco, uncomplicated: Secondary | ICD-10-CM | POA: Diagnosis not present

## 2017-10-14 DIAGNOSIS — E782 Mixed hyperlipidemia: Secondary | ICD-10-CM | POA: Diagnosis not present

## 2017-10-14 DIAGNOSIS — Z008 Encounter for other general examination: Secondary | ICD-10-CM | POA: Diagnosis not present

## 2017-10-14 DIAGNOSIS — E669 Obesity, unspecified: Secondary | ICD-10-CM | POA: Diagnosis not present

## 2017-10-14 DIAGNOSIS — Z719 Counseling, unspecified: Secondary | ICD-10-CM | POA: Diagnosis not present

## 2017-10-14 DIAGNOSIS — Z716 Tobacco abuse counseling: Secondary | ICD-10-CM | POA: Diagnosis not present

## 2017-10-14 DIAGNOSIS — R7301 Impaired fasting glucose: Secondary | ICD-10-CM | POA: Diagnosis not present

## 2018-01-08 DIAGNOSIS — Z719 Counseling, unspecified: Secondary | ICD-10-CM | POA: Diagnosis not present

## 2018-01-08 DIAGNOSIS — R7301 Impaired fasting glucose: Secondary | ICD-10-CM | POA: Diagnosis not present

## 2018-01-08 DIAGNOSIS — Z716 Tobacco abuse counseling: Secondary | ICD-10-CM | POA: Diagnosis not present

## 2018-01-08 DIAGNOSIS — Z008 Encounter for other general examination: Secondary | ICD-10-CM | POA: Diagnosis not present

## 2018-01-08 DIAGNOSIS — E669 Obesity, unspecified: Secondary | ICD-10-CM | POA: Diagnosis not present

## 2018-01-08 DIAGNOSIS — F17220 Nicotine dependence, chewing tobacco, uncomplicated: Secondary | ICD-10-CM | POA: Diagnosis not present

## 2018-01-17 ENCOUNTER — Ambulatory Visit: Payer: BLUE CROSS/BLUE SHIELD | Admitting: Family Medicine

## 2018-04-17 ENCOUNTER — Ambulatory Visit: Payer: BLUE CROSS/BLUE SHIELD | Admitting: Family Medicine

## 2018-04-17 ENCOUNTER — Encounter: Payer: Self-pay | Admitting: Family Medicine

## 2018-04-17 VITALS — BP 142/88 | HR 71 | Temp 98.1°F | Ht 69.0 in | Wt 234.8 lb

## 2018-04-17 DIAGNOSIS — R319 Hematuria, unspecified: Secondary | ICD-10-CM

## 2018-04-17 DIAGNOSIS — N3001 Acute cystitis with hematuria: Secondary | ICD-10-CM | POA: Diagnosis not present

## 2018-04-17 LAB — URINALYSIS, COMPLETE
Bilirubin, UA: NEGATIVE
GLUCOSE, UA: NEGATIVE
Ketones, UA: NEGATIVE
LEUKOCYTES UA: NEGATIVE
Nitrite, UA: NEGATIVE
PROTEIN UA: NEGATIVE
Specific Gravity, UA: 1.01 (ref 1.005–1.030)
UUROB: 0.2 mg/dL (ref 0.2–1.0)
pH, UA: 6 (ref 5.0–7.5)

## 2018-04-17 LAB — CMP14+EGFR
A/G RATIO: 1.9 (ref 1.2–2.2)
ALBUMIN: 4.8 g/dL (ref 3.5–5.5)
ALT: 93 IU/L — ABNORMAL HIGH (ref 0–44)
AST: 121 IU/L — ABNORMAL HIGH (ref 0–40)
Alkaline Phosphatase: 38 IU/L — ABNORMAL LOW (ref 39–117)
BILIRUBIN TOTAL: 0.4 mg/dL (ref 0.0–1.2)
BUN / CREAT RATIO: 11 (ref 9–20)
BUN: 15 mg/dL (ref 6–24)
CO2: 22 mmol/L (ref 20–29)
Calcium: 9.7 mg/dL (ref 8.7–10.2)
Chloride: 100 mmol/L (ref 96–106)
Creatinine, Ser: 1.33 mg/dL — ABNORMAL HIGH (ref 0.76–1.27)
GFR calc Af Amer: 72 mL/min/{1.73_m2} (ref >59)
GFR calc non Af Amer: 62 mL/min/{1.73_m2} (ref >59)
GLUCOSE: 115 mg/dL — AB (ref 65–99)
Globulin, Total: 2.5 g/dL (ref 1.5–4.5)
Potassium: 3.8 mmol/L (ref 3.5–5.2)
SODIUM: 140 mmol/L (ref 134–144)
Total Protein: 7.3 g/dL (ref 6.0–8.5)

## 2018-04-17 LAB — MICROSCOPIC EXAMINATION
BACTERIA UA: NONE SEEN
Epithelial Cells (non renal): NONE SEEN /hpf (ref 0–10)
RENAL EPITHEL UA: NONE SEEN /HPF
WBC, UA: NONE SEEN /hpf (ref 0–5)

## 2018-04-17 MED ORDER — CIPROFLOXACIN HCL 500 MG PO TABS: 500.0000 mg | ORAL_TABLET | Freq: Two times a day (BID) | ORAL | 0 refills | Status: DC

## 2018-04-17 NOTE — Progress Notes (Signed)
BP (!) 142/88   Pulse 71   Temp 98.1 F (36.7 C) (Oral)   Ht _0  (1.753 m)   Wt 234 lb 12.8 oz (106.5 kg)   BMI 34.67 kg/m    Subjective:    Patient ID: Christopher Gilmore, male    DOB: 1965/11/20, 52 y.o.   MRN: [id number]  HPI: Christopher Gilmore is a 52 y.o. male presenting on 04/17/2018 for Hematuria (x 1 week); Dysuria; and Abdominal Pain (LLQ )   HPI Urinary frequency and urgency and dysuria Patient comes in complaining of dysuria and urgency and urinary frequency and lower abdominal pain that is been going on for the past 1 week.  He does say that it starts up sometimes after having intercourse with his wife when she has some kind of infection going on and he feels like that when he started up.  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  He also has blood pressure medicine changed within the past couple months and needs a recheck for renal function  Relevant past medical, surgical, family and social history reviewed and updated as indicated. Interim medical history since our last visit reviewed. Allergies and medications reviewed and updated.  Review of Systems  Constitutional: Negative for chills and fever.  Respiratory: Negative for shortness of breath and wheezing.   Cardiovascular: Negative for chest pain and leg swelling.  Gastrointestinal: Positive for abdominal pain.  Genitourinary: Positive for dysuria, flank pain, frequency, hematuria and urgency.  Musculoskeletal: Negative for back pain and gait problem.  Skin: Negative for rash.  All other systems reviewed and are negative.   Per HPI unless specifically indicated above   Allergies as of 04/17/2018   No Known Allergies     Medication List        Accurate as of 04/17/18 11:07 AM. Always use your most recent med list.          ciprofloxacin 500 MG tablet Commonly known as:  CIPRO Take 1 tablet (500 mg  total) by mouth 2 (two) times daily.   losartan-hydrochlorothiazide 100-12.5 MG tablet Commonly known as:  HYZAAR Take 1 tablet by mouth daily at 2 PM.          Objective:    BP (!) 142/88   Pulse 71   Temp 98.1 F (36.7 C) (Oral)   Ht _1  (1.753 m)   Wt 234 lb 12.8 oz (106.5 kg)   BMI 34.67 kg/m   Wt Readings from Last 3 Encounters:  04/17/18 234 lb 12.8 oz (106.5 kg)  10/29/16 231 lb (104.8 kg)  05/24/16 228 lb (103.4 kg)    Physical Exam  Constitutional: He is oriented to person, place, and time. He appears well-developed and well-nourished. No distress.  Eyes: Conjunctivae are normal. Right eye exhibits no discharge. No scleral icterus.  Neck: Neck supple. No thyromegaly present.  Cardiovascular: Normal rate, regular rhythm, normal heart sounds and intact distal pulses.  No murmur heard. Pulmonary/Chest: Effort normal and breath sounds normal. No respiratory distress. He has no wheezes.  Abdominal: Soft. Bowel sounds are normal. He exhibits no distension and no fluid wave. There is tenderness in the left lower quadrant. There is CVA tenderness (Left CVA). There is no rigidity, no rebound and no guarding.  Musculoskeletal: Normal range of motion. He exhibits no edema.  Lymphadenopathy:    He has no cervical  adenopathy.  Neurological: He is alert and oriented to person, place, and time. Coordination normal.  Skin: Skin is warm and dry. No rash noted. He is not diaphoretic.  Psychiatric: He has a normal mood and affect. His behavior is normal.  Nursing note and vitals reviewed.   Urinalysis: 0-2 RBCs, otherwise negative    Assessment & Plan:   Problem List Items Addressed This Visit    None    Visit Diagnoses    Acute cystitis with hematuria    -  Primary   Patient has been told on DOT physical and microscopic hematuria and has been having some lower abdominal pain and dysuria, noticed after intercourse   Relevant Medications   ciprofloxacin (CIPRO) 500 MG  tablet   Other Relevant Orders   CMP14+EGFR   Hematuria, unspecified type       Relevant Medications   ciprofloxacin (CIPRO) 500 MG tablet   Other Relevant Orders   Urinalysis, Complete   CMP14+EGFR       Follow up plan: Return in about 6 months (around 10/16/2018), or if symptoms worsen or fail to improve.  Counseling provided for all of the vaccine components Orders Placed This Encounter  Procedures  . Urinalysis, Complete  . Woodmere Dettinger, MD [facility] 04/17/2018, 11:07 AM

## 2018-04-18 ENCOUNTER — Other Ambulatory Visit: Payer: Self-pay | Admitting: *Deleted

## 2018-04-18 DIAGNOSIS — R7989 Other specified abnormal findings of blood chemistry: Secondary | ICD-10-CM

## 2018-04-21 DIAGNOSIS — F17220 Nicotine dependence, chewing tobacco, uncomplicated: Secondary | ICD-10-CM | POA: Diagnosis not present

## 2018-04-21 DIAGNOSIS — R7301 Impaired fasting glucose: Secondary | ICD-10-CM | POA: Diagnosis not present

## 2018-04-21 DIAGNOSIS — E669 Obesity, unspecified: Secondary | ICD-10-CM | POA: Diagnosis not present

## 2018-04-21 DIAGNOSIS — E782 Mixed hyperlipidemia: Secondary | ICD-10-CM | POA: Diagnosis not present

## 2018-04-21 DIAGNOSIS — Z719 Counseling, unspecified: Secondary | ICD-10-CM | POA: Diagnosis not present

## 2018-04-28 ENCOUNTER — Telehealth: Payer: Self-pay | Admitting: Family Medicine

## 2018-04-28 NOTE — Telephone Encounter (Signed)
Apt made 9/26 for re check dysuria

## 2018-05-01 ENCOUNTER — Encounter: Payer: Self-pay | Admitting: Family Medicine

## 2018-05-01 ENCOUNTER — Ambulatory Visit: Payer: BLUE CROSS/BLUE SHIELD | Admitting: Family Medicine

## 2018-05-01 VITALS — BP 136/90 | HR 63 | Temp 97.5°F | Ht 69.0 in | Wt 230.8 lb

## 2018-05-01 DIAGNOSIS — I10 Essential (primary) hypertension: Secondary | ICD-10-CM

## 2018-05-01 DIAGNOSIS — R300 Dysuria: Secondary | ICD-10-CM

## 2018-05-01 LAB — URINALYSIS, COMPLETE
BILIRUBIN UA: NEGATIVE
GLUCOSE, UA: NEGATIVE
Ketones, UA: NEGATIVE
LEUKOCYTES UA: NEGATIVE
Nitrite, UA: NEGATIVE
PH UA: 6 (ref 5.0–7.5)
PROTEIN UA: NEGATIVE
Urobilinogen, Ur: 0.2 mg/dL (ref 0.2–1.0)

## 2018-05-01 LAB — MICROSCOPIC EXAMINATION
BACTERIA UA: NONE SEEN
WBC UA: NONE SEEN /HPF (ref 0–5)

## 2018-05-01 MED ORDER — AMLODIPINE BESYLATE 10 MG PO TABS: 10.0000 mg | ORAL_TABLET | Freq: Every day | ORAL | 3 refills | Status: DC

## 2018-05-01 NOTE — Progress Notes (Signed)
BP 136/90   Pulse 63   Temp (!) 97.5 F (36.4 C) (Oral)   Ht 5\' 9"  (1.753 m)   Wt 230 lb 12.8 oz (104.7 kg)   BMI 34.08 kg/m    Subjective:    Patient ID: Christopher Gilmore, male    DOB: 08-31-1965, 52 y.o.   MRN: [id number]  HPI: Christopher Gilmore is a 52 y.o. male presenting on 05/01/2018 for Hematuria (Patient was seen 9/12 and placed on cipro. States he is still having LLQ pain and dysuira in the mornings. Patient thinks he passed a stone tuesday night) and Hypertension (wants to talk about Losrtan - HCTZ)   HPI Hypertension Patient is currently on losartan-hydrochlorothiazide, he feels like this medication is causing a lot of his urinary issues and has had problems over the past 9 months and he would like to transition to something else if possible, and their blood pressure today is 136/90. Patient denies any lightheadedness or dizziness. Patient denies headaches, blurred vision, chest pains, shortness of breath, or weakness. Denies any side effects from medication and is content with current medication.   Dysuria Patient is coming in for recheck on urine.  He is still complaining of some dysuria and left-sided lower abdominal pain in the mornings but it improves and is only intermittent now.  He thinks he may have passed a stone 2 nights ago when he saw out in the bowl but was not able to catch it to bring it in.  He has not been passing any blood since then and has not had the constant pain that he had before then.  He was placed on Cipro on 04/17/2018 for treatment for this.  He denies any fevers or chills.  He does have some pain rating around to his left flank but only intermittently right now.  Relevant past medical, surgical, family and social history reviewed and updated as indicated. Interim medical history since our last visit reviewed. Allergies and medications reviewed and updated.  Review of Systems  Constitutional: Negative for chills and fever.  Eyes: Negative for visual  disturbance.  Respiratory: Negative for shortness of breath and wheezing.   Cardiovascular: Negative for chest pain and leg swelling.  Gastrointestinal: Positive for abdominal pain.  Genitourinary: Positive for dysuria. Negative for frequency, hematuria and urgency.  Musculoskeletal: Negative for back pain and gait problem.  Skin: Negative for rash.  Neurological: Negative for dizziness, weakness and light-headedness.  All other systems reviewed and are negative.   Per HPI unless specifically indicated above   Allergies as of 05/01/2018   No Known Allergies     Medication List        Accurate as of 05/01/18  4:34 PM. Always use your most recent med list.          amLODipine 10 MG tablet Commonly known as:  NORVASC Take 1 tablet (10 mg total) by mouth daily.          Objective:    BP 136/90   Pulse 63   Temp (!) 97.5 F (36.4 C) (Oral)   Ht 5\' 9"  (1.753 m)   Wt 230 lb 12.8 oz (104.7 kg)   BMI 34.08 kg/m   Wt Readings from Last 3 Encounters:  05/01/18 230 lb 12.8 oz (104.7 kg)  04/17/18 234 lb 12.8 oz (106.5 kg)  10/29/16 231 lb (104.8 kg)    Physical Exam  Constitutional: He is oriented to person, place, and time. He appears well-developed and well-nourished. No  distress.  Eyes: Conjunctivae are normal. No scleral icterus.  Cardiovascular: Normal rate, regular rhythm, normal heart sounds and intact distal pulses.  No murmur heard. Pulmonary/Chest: Effort normal and breath sounds normal. No respiratory distress. He has no wheezes.  Abdominal: Soft. Bowel sounds are normal. He exhibits no distension. There is tenderness (Left-sided abdominal tenderness, mild, no rebound or guarding).  Musculoskeletal: Normal range of motion. He exhibits no edema.  Neurological: He is alert and oriented to person, place, and time. Coordination normal.  Skin: Skin is warm and dry. No rash noted. He is not diaphoretic.  Psychiatric: He has a normal mood and affect. His behavior is  normal.  Nursing note and vitals reviewed.   Urinalysis: 0-2 RBCs, 0-10 epithelial cells, no bacteria, trace blood, otherwise negative    Assessment & Plan:   Problem List Items Addressed This Visit      Cardiovascular and Mediastinum   Hypertension   Relevant Medications   amLODipine (NORVASC) 10 MG tablet    Other Visit Diagnoses    Dysuria    -  Primary   Relevant Orders   Urinalysis, Complete      Will monitor dysuria for now, is improving since 2 days ago when he felt like he passed a stone.  He finished the Cipro.  Switch from losartan-hydrochlorothiazide because of elevated renal function and patient feels like the diuretic is causing a lot of issues to Norvasc  Follow up plan: Return in about 4 weeks (around 05/29/2018), or if symptoms worsen or fail to improve, for Quick hypertension recheck to see if medication is working.  Counseling provided for all of the vaccine components Orders Placed This Encounter  Procedures  . Urinalysis, Complete    Arville Care, MD [facility] 05/01/2018, 4:34 PM

## 2018-05-08 ENCOUNTER — Ambulatory Visit: Payer: BLUE CROSS/BLUE SHIELD | Admitting: Family Medicine

## 2018-05-08 ENCOUNTER — Encounter: Payer: Self-pay | Admitting: Family Medicine

## 2018-05-08 VITALS — BP 138/89 | HR 69 | Temp 99.5°F | Ht 69.0 in | Wt 230.8 lb

## 2018-05-08 DIAGNOSIS — R300 Dysuria: Secondary | ICD-10-CM

## 2018-05-08 DIAGNOSIS — N50812 Left testicular pain: Secondary | ICD-10-CM | POA: Diagnosis not present

## 2018-05-08 LAB — MICROSCOPIC EXAMINATION
Bacteria, UA: NONE SEEN
Epithelial Cells (non renal): NONE SEEN /hpf (ref 0–10)
Renal Epithel, UA: NONE SEEN /hpf
WBC, UA: NONE SEEN /hpf (ref 0–5)

## 2018-05-08 LAB — URINALYSIS, COMPLETE
BILIRUBIN UA: NEGATIVE
GLUCOSE, UA: NEGATIVE
KETONES UA: NEGATIVE
LEUKOCYTES UA: NEGATIVE
NITRITE UA: NEGATIVE
Protein, UA: NEGATIVE
SPEC GRAV UA: 1.01 (ref 1.005–1.030)
Urobilinogen, Ur: 0.2 mg/dL (ref 0.2–1.0)
pH, UA: 5.5 (ref 5.0–7.5)

## 2018-05-08 MED ORDER — SULFAMETHOXAZOLE-TRIMETHOPRIM 800-160 MG PO TABS: 1.0000 | ORAL_TABLET | Freq: Two times a day (BID) | ORAL | 0 refills | Status: DC

## 2018-05-08 NOTE — Progress Notes (Signed)
BP 138/89   Pulse 69   Temp 99.5 F (37.5 C) (Oral)   Ht 5\' 9"  (1.753 m)   Wt 230 lb 12.8 oz (104.7 kg)   BMI 34.08 kg/m    Subjective:    Patient ID: Christopher Gilmore, male    DOB: 1965/10/16, 52 y.o.   MRN: [id number]  HPI: Christopher Gilmore is a 52 y.o. male presenting on 05/08/2018 for Hypertension (follow up) and Dysuria (Patient was seen 9/26 and states it is a little better but not gone. )   HPI Dysuria and left testicular pain Patient comes in today complaining of dysuria and left testicular pain.  He says he was having the dysuria on 05/01/2018 and that has been improving, it was thought at the time to be a renal stone.  Now he is having left testicular and scrotal pain.  He does not feel any nodule or mass.  He says that the left testicular pain is been going on for the past couple days.  He thinks he may have had similar pain previously but cannot recall exactly.  Patient denies any fevers or chills or flank pain or pain anywhere else.  Relevant past medical, surgical, family and social history reviewed and updated as indicated. Interim medical history since our last visit reviewed. Allergies and medications reviewed and updated.  Review of Systems  Constitutional: Negative for chills and fever.  Eyes: Negative for visual disturbance.  Respiratory: Negative for shortness of breath and wheezing.   Cardiovascular: Negative for chest pain and leg swelling.  Gastrointestinal: Negative for abdominal pain.  Genitourinary: Positive for dysuria, hematuria and testicular pain. Negative for difficulty urinating, discharge, flank pain, penile pain, penile swelling and scrotal swelling.  Musculoskeletal: Negative for back pain and gait problem.  Skin: Negative for rash.  All other systems reviewed and are negative.   Per HPI unless specifically indicated above   Allergies as of 05/08/2018   No Known Allergies     Medication List        Accurate as of 05/08/18  4:04 PM. Always use  your most recent med list.          amLODipine 10 MG tablet Commonly known as:  NORVASC Take 1 tablet (10 mg total) by mouth daily.   sulfamethoxazole-trimethoprim 800-160 MG tablet Commonly known as:  BACTRIM DS,SEPTRA DS Take 1 tablet by mouth 2 (two) times daily.          Objective:    BP 138/89   Pulse 69   Temp 99.5 F (37.5 C) (Oral)   Ht 5\' 9"  (1.753 m)   Wt 230 lb 12.8 oz (104.7 kg)   BMI 34.08 kg/m   Wt Readings from Last 3 Encounters:  05/08/18 230 lb 12.8 oz (104.7 kg)  05/01/18 230 lb 12.8 oz (104.7 kg)  04/17/18 234 lb 12.8 oz (106.5 kg)    Physical Exam  Constitutional: He is oriented to person, place, and time. He appears well-developed and well-nourished. No distress.  Eyes: Conjunctivae are normal. No scleral icterus.  Abdominal: Hernia confirmed negative in the right inguinal area and confirmed negative in the left inguinal area.  Genitourinary: Right testis shows tenderness. Right testis shows no mass and no swelling. Right testis is descended. Cremasteric reflex is absent on the right side. Left testis shows no mass. Left testis is descended.  Musculoskeletal: Normal range of motion. He exhibits no edema.  Neurological: He is alert and oriented to person, place, and time. Coordination  normal.  Skin: Skin is warm and dry. No rash noted. He is not diaphoretic.  Psychiatric: He has a normal mood and affect. His behavior is normal.  Nursing note and vitals reviewed.  Urinalysis: 3-10 RBCs, trace RBCs, otherwise negative     Assessment & Plan:   Problem List Items Addressed This Visit    None    Visit Diagnoses    Dysuria    -  Primary   Relevant Medications   sulfamethoxazole-trimethoprim (BACTRIM DS,SEPTRA DS) 800-160 MG tablet   Other Relevant Orders   Urinalysis, Complete (Completed)   Ambulatory referral to Urology   Left testicular pain       Relevant Orders   US SCROTUM W/DOPPLER (Completed)   Ambulatory referral to Urology        Patient has left testicular pain and is tests he is elevated on that side with absent cremaster and he does not want to go for the ultrasound today and wants to try and plan it for Monday, instructed him that if he has any increased pain to go to the emergency department or give [place] a call back.  We will try and get it scheduled for Monday  His dysuria is still occurring and it shows blood in his urine but no signs of infection, will treat for possible infection because of other symptoms in the scrotum with Bactrim but still leaning towards the problem being renal stones Follow up plan: Return if symptoms worsen or fail to improve.  Counseling provided for all of the vaccine components Orders Placed This Encounter  Procedures  . US SCROTUM W/DOPPLER  . Urinalysis, Complete    Arville Care, MD [facility] 05/08/2018, 4:04 PM

## 2018-05-12 ENCOUNTER — Ambulatory Visit (HOSPITAL_COMMUNITY)
Admission: RE | Admit: 2018-05-12 | Discharge: 2018-05-12 | Disposition: A | Discharge status: Home / Self Care | Payer: BLUE CROSS/BLUE SHIELD | Source: Ambulatory Visit | Attending: Family Medicine | Admitting: Family Medicine

## 2018-05-12 DIAGNOSIS — N50812 Left testicular pain: Secondary | ICD-10-CM

## 2018-05-12 DIAGNOSIS — I861 Scrotal varices: Secondary | ICD-10-CM | POA: Insufficient documentation

## 2018-05-12 DIAGNOSIS — N433 Hydrocele, unspecified: Secondary | ICD-10-CM | POA: Diagnosis not present

## 2018-05-29 ENCOUNTER — Ambulatory Visit: Payer: BLUE CROSS/BLUE SHIELD | Admitting: Family Medicine

## 2018-07-21 DIAGNOSIS — E669 Obesity, unspecified: Secondary | ICD-10-CM | POA: Diagnosis not present

## 2018-07-21 DIAGNOSIS — Z719 Counseling, unspecified: Secondary | ICD-10-CM | POA: Diagnosis not present

## 2018-07-21 DIAGNOSIS — R7301 Impaired fasting glucose: Secondary | ICD-10-CM | POA: Diagnosis not present

## 2018-07-21 DIAGNOSIS — Z008 Encounter for other general examination: Secondary | ICD-10-CM | POA: Diagnosis not present

## 2018-09-24 ENCOUNTER — Ambulatory Visit: Payer: BLUE CROSS/BLUE SHIELD | Admitting: Family Medicine

## 2018-09-25 ENCOUNTER — Encounter: Payer: Self-pay | Admitting: Family Medicine

## 2018-09-25 ENCOUNTER — Ambulatory Visit: Payer: BLUE CROSS/BLUE SHIELD | Admitting: Family Medicine

## 2018-09-25 VITALS — BP 136/80 | HR 74 | Temp 97.9°F | Ht 69.0 in | Wt 230.0 lb

## 2018-09-25 DIAGNOSIS — K642 Third degree hemorrhoids: Secondary | ICD-10-CM | POA: Diagnosis not present

## 2018-09-25 NOTE — Progress Notes (Signed)
BP 136/80   Pulse 74   Temp 97.9 F (36.6 C) (Oral)   Ht 5\' 9"  (1.753 m)   Wt 230 lb (104.3 kg)   BMI 33.97 kg/m    Subjective:    Patient ID: Christopher Gilmore, male    DOB: 1966/03/19, 53 y.o.   MRN: [id number]  HPI: Christopher Gilmore is a 53 y.o. male presenting on 09/25/2018 for Hemorrhoids   HPI Hemorrhoids/rectal pain Patient comes in complaining of hemorrhoids and rectal pain, he is concerned because it is larger than he is ever had one before and definitely more painful than is ever had.  He says it cropped up a couple days ago and he is never had 1 quite like this.  He has been continuing to use his hemorrhoid creams.  He says he thinks it came on over the weekend when he was on a long driving trip and then also doing a lot more physical activity and lifting and he felt it pop out and then it has worsened over the past few days.  He denies any diarrhea but has had harder stools and when he does have a bowel movement and cause a lot of pain there.  He has not had any bleeding from it to this point.  He denies any pain anywhere else.  He says it is very difficult to get comfortable while sitting because of the hemorrhoid and it has definitely worsened this morning as well.  Relevant past medical, surgical, family and social history reviewed and updated as indicated. Interim medical history since our last visit reviewed. Allergies and medications reviewed and updated.  Review of Systems  Constitutional: Negative for chills and fever.  Respiratory: Negative for shortness of breath and wheezing.   Cardiovascular: Negative for chest pain and leg swelling.  Gastrointestinal: Positive for rectal pain. Negative for anal bleeding, blood in stool, constipation and diarrhea.  Musculoskeletal: Negative for back pain and gait problem.  Skin: Negative for rash.  All other systems reviewed and are negative.   Per HPI unless specifically indicated above   Allergies as of 09/25/2018   No Known  Allergies     Medication List       Accurate as of September 25, 2018 11:28 AM. Always use your most recent med list.        amLODipine 10 MG tablet Commonly known as:  NORVASC Take 1 tablet (10 mg total) by mouth daily.          Objective:    BP 136/80   Pulse 74   Temp 97.9 F (36.6 C) (Oral)   Ht 5\' 9"  (1.753 m)   Wt 230 lb (104.3 kg)   BMI 33.97 kg/m   Wt Readings from Last 3 Encounters:  09/25/18 230 lb (104.3 kg)  05/08/18 230 lb 12.8 oz (104.7 kg)  05/01/18 230 lb 12.8 oz (104.7 kg)    Physical Exam Vitals signs and nursing note reviewed.  Constitutional:      General: He is not in acute distress.    Appearance: He is well-developed. He is not diaphoretic.  Eyes:     General: No scleral icterus.    Conjunctiva/sclera: Conjunctivae normal.  Neck:     Thyroid: No thyromegaly.  Genitourinary:    Rectum: External hemorrhoid present.    Musculoskeletal: Normal range of motion.  Skin:    General: Skin is warm and dry.     Findings: No rash.  Neurological:     Mental  Status: He is alert and oriented to person, place, and time.     Coordination: Coordination normal.  Psychiatric:        Behavior: Behavior normal.         Assessment & Plan:   Problem List Items Addressed This Visit    None    Visit Diagnoses    Grade III hemorrhoids    -  Primary   Relevant Orders   Ambulatory referral to General Surgery      Is already been doing creams consistently but this is the largest one he ever had is been very painful, looks like it needs to be excised, will send for surgery Follow up plan: Return if symptoms worsen or fail to improve.  Counseling provided for all of the vaccine components Orders Placed This Encounter  Procedures  . Ambulatory referral to General Surgery    Arville Care, MD [facility] 09/25/2018, 11:28 AM

## 2018-09-26 ENCOUNTER — Encounter: Payer: Self-pay | Admitting: Family Medicine

## 2018-09-30 ENCOUNTER — Ambulatory Visit: Payer: BLUE CROSS/BLUE SHIELD | Admitting: General Surgery

## 2018-09-30 ENCOUNTER — Encounter: Payer: Self-pay | Admitting: General Surgery

## 2018-09-30 VITALS — BP 135/78 | HR 69 | Temp 98.9°F | Resp 16 | Wt 230.0 lb

## 2018-09-30 DIAGNOSIS — K645 Perianal venous thrombosis: Secondary | ICD-10-CM

## 2018-09-30 NOTE — Patient Instructions (Signed)

## 2018-09-30 NOTE — Progress Notes (Signed)
Christopher Gilmore; [id number]; 1965/10/12   HPI Patient is a 53 year old white male who was referred to my care by Dr. Louanne Skye for evaluation treatment of hemorrhoidal disease.  Patient states he has had intermittent swollen external hemorrhoids for many years.  Last week, he started having increasing pain and swelling in 1 of those hemorrhoids.  He was seen by his primary care physician.  He started using creams.  Several days ago, he noticed some blood in the toilet bowl when he was wiping himself.  He did have relief of the pain.  He has not had a colonoscopy.  He just turned 52.  He currently has 0 out of 10 pain.  Patient states he intermittently has some constipation, but it is not extreme. Past Medical History:  Diagnosis Date  . Gout     History reviewed. No pertinent surgical history.  Family History  Problem Relation Age of Onset  . Cancer Mother        lung    Current Outpatient Medications on File Prior to Visit  Medication Sig Dispense Refill  . amLODipine (NORVASC) 10 MG tablet Take 1 tablet (10 mg total) by mouth daily. 90 tablet 3   No current facility-administered medications on file prior to visit.     No Known Allergies  Social History   Substance and Sexual Activity  Alcohol Use Yes   Comment: very rare    Social History   Tobacco Use  Smoking Status Never Smoker  Smokeless Tobacco Current User  . Types: Snuff    Review of Systems  Constitutional: Negative.   HENT: Negative.   Eyes: Negative.   Respiratory: Negative.   Cardiovascular: Negative.   Gastrointestinal: Negative.   Genitourinary: Negative.   Musculoskeletal: Negative.   Skin: Negative.   Neurological: Negative.   Endo/Heme/Allergies: Negative.   Psychiatric/Behavioral: Negative.     Objective   Vitals:   09/30/18 1313  BP: 135/78  Pulse: 69  Resp: 16  Temp: 98.9 F (37.2 C)    Physical Exam Vitals signs reviewed.  Constitutional:      Appearance: Normal appearance. He  is not ill-appearing.  HENT:     Head: Normocephalic and atraumatic.  Cardiovascular:     Rate and Rhythm: Normal rate and regular rhythm.     Heart sounds: Normal heart sounds. No murmur. No friction rub. No gallop.   Pulmonary:     Effort: Pulmonary effort is normal. No respiratory distress.     Breath sounds: Normal breath sounds. No stridor. No wheezing, rhonchi or rales.  Abdominal:     General: Abdomen is flat. Bowel sounds are normal. There is no distension.     Palpations: Abdomen is soft.     Tenderness: There is no abdominal tenderness. There is no guarding or rebound.  Genitourinary:    Comments: Large right-sided external hemorrhoid with clot present.  This was extruded with digital pressure.  No other external hemorrhoids noted.  No internal hemorrhoids present.  Sphincter tone normal. Skin:    General: Skin is warm and dry.  Neurological:     Mental Status: He is alert and oriented to person, place, and time.     Assessment  Thrombosed external hemorrhoid, resolving Plan   Patient was told to expect some blood from the hemorrhoid over the next few days.  He will return should he have a significant residual hemorrhoid present.  He was also told that he is due for screening colonoscopy due to age.  He  understands and agrees.  Follow-up as needed.

## 2018-10-17 ENCOUNTER — Ambulatory Visit: Payer: BLUE CROSS/BLUE SHIELD | Admitting: Family Medicine

## 2018-12-31 ENCOUNTER — Other Ambulatory Visit: Payer: Self-pay

## 2018-12-31 ENCOUNTER — Ambulatory Visit (INDEPENDENT_AMBULATORY_CARE_PROVIDER_SITE_OTHER): Payer: BLUE CROSS/BLUE SHIELD | Admitting: Family Medicine

## 2018-12-31 DIAGNOSIS — M10372 Gout due to renal impairment, left ankle and foot: Secondary | ICD-10-CM | POA: Diagnosis not present

## 2018-12-31 MED ORDER — PREDNISONE 10 MG (21) PO TBPK: ORAL_TABLET | ORAL | 0 refills | Status: DC

## 2018-12-31 NOTE — Progress Notes (Signed)
Telephone visit  Subjective: CC: Gout PCP: Dettinger, Elige Radon, MD QRF:XJOIT C Sibayan is a 53 y.o. male calls for telephone consult today. Patient provides verbal consent for consult held via phone.  Location of patient: home Location of provider: [facility] Others present for call: none  1. Gout Patient reports a history of gout in the past. Last flare was 2017.  He reports pain and swelling in left ankle that has been going on since Saturday night.  He reports that he hasn't been eating right because he has been working from home.  He has been using ibuprofen to treat.   ROS: Per HPI  No Known Allergies Past Medical History:  Diagnosis Date  . Gout     Current Outpatient Medications:  .  amLODipine (NORVASC) 10 MG tablet, Take 1 tablet (10 mg total) by mouth daily., Disp: 90 tablet, Rfl: 3  Assessment/ Plan: 53 y.o. male   1. Acute gout due to renal impairment involving left ankle I suspect gout flare is related to renal impairment.  I reviewed his last renal function panel and he had an elevated creatinine to 1.33.  I have advised him against use of oral NSAIDs given this recent finding.  Instead I will prescribe a prednisone Dosepak in efforts to relieve his gout flare.  I have advised him to go ahead and schedule follow-up visit with his PCP for repeat renal function testing and check of his uric acid level.  May need to consider placing patient on something like losartan to reduce risk of gout flare and protect renal function.  I will defer this to his PCP.  He will call after he evaluates his schedule to make an appointment. - predniSONE (STERAPRED UNI-PAK 21 TAB) 10 MG (21) TBPK tablet; As directed x 6 days  Dispense: 21 tablet; Refill: 0   Start time: 3:47pm End time: 3:53pm  Total time spent on patient care (including telephone call/ virtual visit): 16 minutes  Saim Almanza Hulen Skains, DO [facility] [phone]

## 2019-05-20 DIAGNOSIS — Z23 Encounter for immunization: Secondary | ICD-10-CM | POA: Diagnosis not present

## 2019-05-22 ENCOUNTER — Other Ambulatory Visit: Payer: Self-pay | Admitting: Family Medicine

## 2019-05-22 DIAGNOSIS — I10 Essential (primary) hypertension: Secondary | ICD-10-CM

## 2019-05-24 ENCOUNTER — Other Ambulatory Visit: Payer: Self-pay | Admitting: Family Medicine

## 2019-05-24 DIAGNOSIS — I10 Essential (primary) hypertension: Secondary | ICD-10-CM

## 2019-06-19 ENCOUNTER — Other Ambulatory Visit: Payer: Self-pay

## 2019-06-19 DIAGNOSIS — Z20822 Contact with and (suspected) exposure to covid-19: Secondary | ICD-10-CM

## 2019-06-22 LAB — NOVEL CORONAVIRUS, NAA: SARS-CoV-2, NAA: NOT DETECTED

## 2019-07-22 ENCOUNTER — Encounter: Payer: Self-pay | Admitting: Family Medicine

## 2019-07-22 ENCOUNTER — Ambulatory Visit (INDEPENDENT_AMBULATORY_CARE_PROVIDER_SITE_OTHER): Payer: BC Managed Care – PPO | Admitting: Family Medicine

## 2019-07-22 ENCOUNTER — Other Ambulatory Visit: Payer: Self-pay

## 2019-07-22 DIAGNOSIS — Z13220 Encounter for screening for lipoid disorders: Secondary | ICD-10-CM

## 2019-07-22 DIAGNOSIS — J0110 Acute frontal sinusitis, unspecified: Secondary | ICD-10-CM

## 2019-07-22 DIAGNOSIS — I10 Essential (primary) hypertension: Secondary | ICD-10-CM

## 2019-07-22 MED ORDER — AZITHROMYCIN 250 MG PO TABS: ORAL_TABLET | ORAL | 0 refills | Status: DC

## 2019-07-22 MED ORDER — AMLODIPINE BESYLATE 10 MG PO TABS: 10.0000 mg | ORAL_TABLET | Freq: Every day | ORAL | 3 refills | Status: DC

## 2019-07-22 NOTE — Progress Notes (Signed)
Virtual Visit via telephone Note  I connected with Christopher Gilmore on 07/22/19 at 1557 by telephone and verified that I am speaking with the correct person using two identifiers. Christopher Gilmore is currently located at home and no other people are currently with her during visit. The provider, Fransisca Kaufmann Srinika Delone, MD is located in their office at time of visit.  Call ended at 1606  I discussed the limitations, risks, security and privacy concerns of performing an evaluation and management service by telephone and the availability of in person appointments. I also discussed with the patient that there may be a patient responsible charge related to this service. The patient expressed understanding and agreed to proceed.   History and Present Illness: Hypertension Patient is currently on amlodipine, and their blood pressure today is unknown. Patient denies any lightheadedness or dizziness. Patient denies headaches, blurred vision, chest pains, shortness of breath, or weakness. Denies any side effects from medication and is content with current medication.   Patient feels like he has sinus pressure that has been going on for 3 weeks and he is using flonase and it is not improving.  He denies covid contacts.  He denies fever or chills or body aches.  1. Essential hypertension   2. Lipid screening   3. Acute non-recurrent frontal sinusitis     Outpatient Encounter Medications as of 07/22/2019  Medication Sig  . amLODipine (NORVASC) 10 MG tablet Take 1 tablet (10 mg total) by mouth daily. (Needs to be seen before next refill)  . azithromycin (ZITHROMAX) 250 MG tablet Take 2 the first day and then one each day after.  . [DISCONTINUED] amLODipine (NORVASC) 10 MG tablet Take 1 tablet (10 mg total) by mouth daily. (Needs to be seen before next refill)  . [DISCONTINUED] predniSONE (STERAPRED UNI-PAK 21 TAB) 10 MG (21) TBPK tablet As directed x 6 days   No facility-administered encounter medications on  file as of 07/22/2019.    Review of Systems  Constitutional: Negative for chills and fever.  HENT: Positive for postnasal drip, sinus pressure and sneezing. Negative for congestion, ear discharge, ear pain, rhinorrhea, sore throat and voice change.   Eyes: Negative for pain, discharge, redness and visual disturbance.  Respiratory: Negative for cough, shortness of breath and wheezing.   Cardiovascular: Negative for chest pain and leg swelling.  Musculoskeletal: Negative for back pain and gait problem.  Skin: Negative for rash.  Neurological: Negative for dizziness, weakness and light-headedness.  All other systems reviewed and are negative.   Observations/Objective: Patient sounds comfortable and in no acute distress  Assessment and Plan: Problem List Items Addressed This Visit      Cardiovascular and Mediastinum   Hypertension - Primary   Relevant Medications   amLODipine (NORVASC) 10 MG tablet   Other Relevant Orders   CBC with Differential/Platelet   CMP14+EGFR    Other Visit Diagnoses    Lipid screening       Relevant Orders   CBC with Differential/Platelet   Lipid panel   Acute non-recurrent frontal sinusitis       Relevant Medications   azithromycin (ZITHROMAX) 250 MG tablet       Follow up plan: Return in about 1 year (around 07/21/2020), or if symptoms worsen or fail to improve.     I discussed the assessment and treatment plan with the patient. The patient was provided an opportunity to ask questions and all were answered. The patient agreed with the plan and demonstrated an  understanding of the instructions.   The patient was advised to call back or seek an in-person evaluation if the symptoms worsen or if the condition fails to improve as anticipated.  The above assessment and management plan was discussed with the patient. The patient verbalized understanding of and has agreed to the management plan. Patient is aware to call the clinic if symptoms persist  or worsen. Patient is aware when to return to the clinic for a follow-up visit. Patient educated on when it is appropriate to go to the emergency department.    I provided 9 minutes of non-face-to-face time during this encounter.    Worthy Rancher, MD

## 2019-08-23 ENCOUNTER — Ambulatory Visit: Payer: BLUE CROSS/BLUE SHIELD | Attending: Internal Medicine

## 2019-08-23 DIAGNOSIS — Z23 Encounter for immunization: Secondary | ICD-10-CM | POA: Insufficient documentation

## 2019-08-23 NOTE — Progress Notes (Signed)
   Covid-19 Vaccination Clinic  Name:  Christopher Gilmore    MRN: [id number] DOB: 19-Aug-1965  08/23/2019  [name] was observed post Covid-19 immunization for 15 minutes without incidence. He was provided with Vaccine Information Sheet and instruction to access the V-Safe system.   [name] was instructed to call 911 with any severe reactions post vaccine: Marland Kitchen Difficulty breathing  . Swelling of your face and throat  . A fast heartbeat  . A bad rash all over your body  . Dizziness and weakness

## 2019-09-13 ENCOUNTER — Ambulatory Visit: Payer: Self-pay | Attending: Internal Medicine

## 2019-09-13 DIAGNOSIS — Z23 Encounter for immunization: Secondary | ICD-10-CM | POA: Insufficient documentation

## 2019-09-13 NOTE — Progress Notes (Signed)
   Covid-19 Vaccination Clinic  Name:  Christopher Gilmore    MRN: [id number] DOB: 1966-07-23  09/13/2019  [name] was observed post Covid-19 immunization for 15 minutes without incidence. He was provided with Vaccine Information Sheet and instruction to access the V-Safe system.   [name] was instructed to call 911 with any severe reactions post vaccine: Marland Kitchen Difficulty breathing  . Swelling of your face and throat  . A fast heartbeat  . A bad rash all over your body  . Dizziness and weakness    Immunizations Administered    Name Date Dose VIS Date Route   Pfizer COVID-19 Vaccine 09/13/2019 10:40 AM 0.3 mL 07/17/2019 Intramuscular   Manufacturer: [employer], [employer]   Lot: [id number]   NDC: [address]-1

## 2020-02-19 IMAGING — US US SCROTUM W/ DOPPLER COMPLETE
1 series · 13 of 25 positions shown · non-contrast
Comparison: None.

CLINICAL DATA: Left testicular pain for the past 4 days.

EXAM:
SCROTAL ULTRASOUND
DOPPLER ULTRASOUND OF THE TESTICLES
TECHNIQUE: Complete ultrasound examination of the testicles, epididymis, and
other scrotal structures was performed. Color and spectral Doppler
ultrasound were also utilized to evaluate blood flow to the
testicles.

[Series 1: us scrotum w/ doppler complete · 13 of 91 slices shown]
[im 1/91]
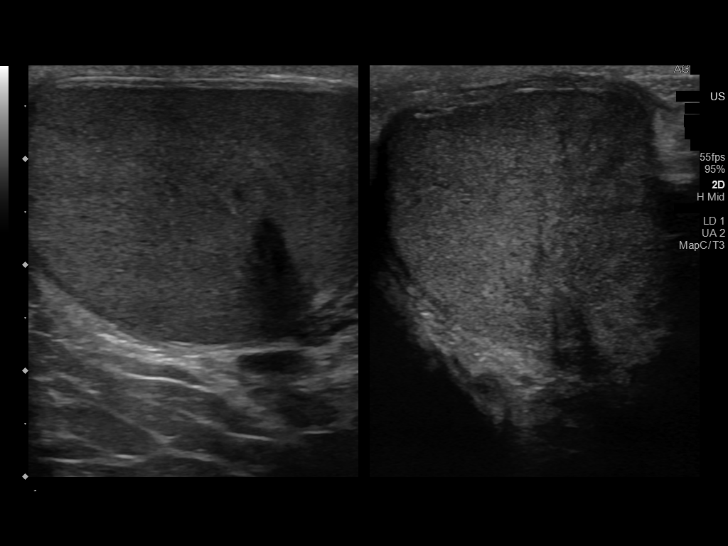
[im 8/91]
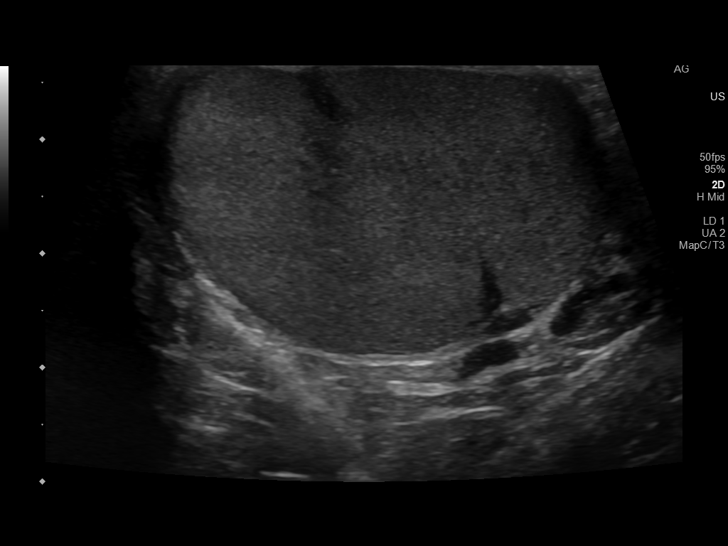
[im 16/91]
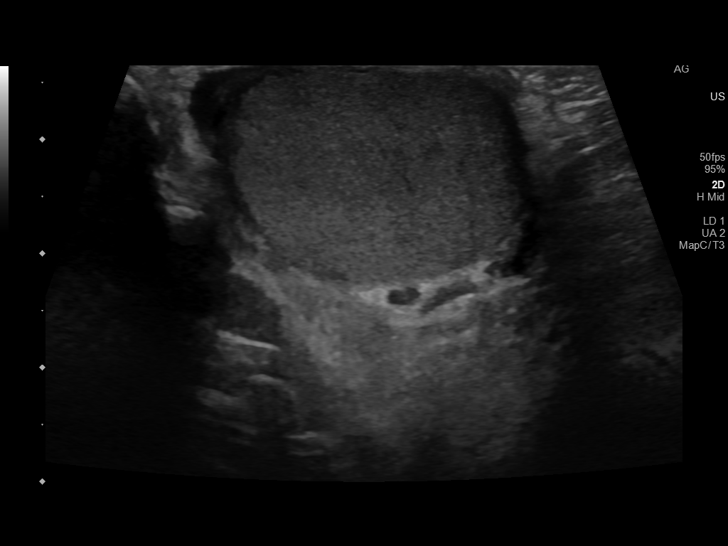
[im 23/91]
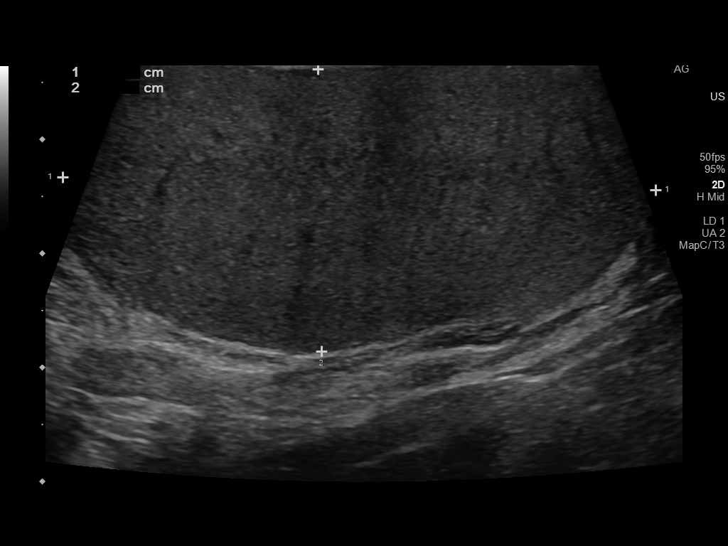
[im 31/91]
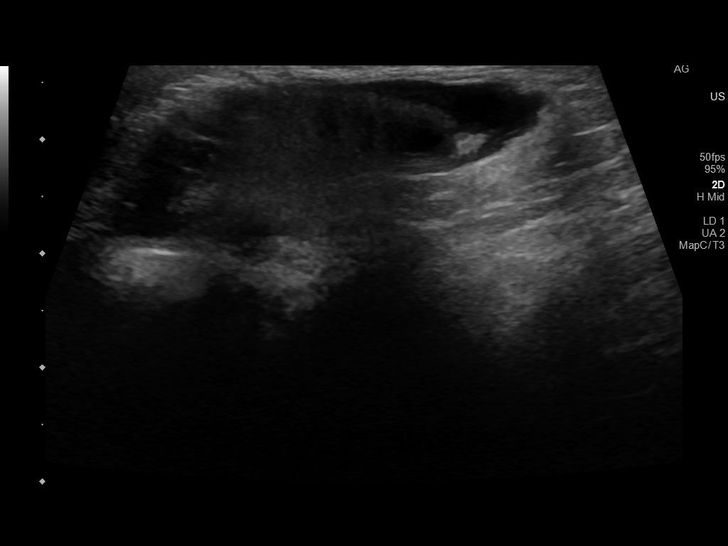
[im 38/91]
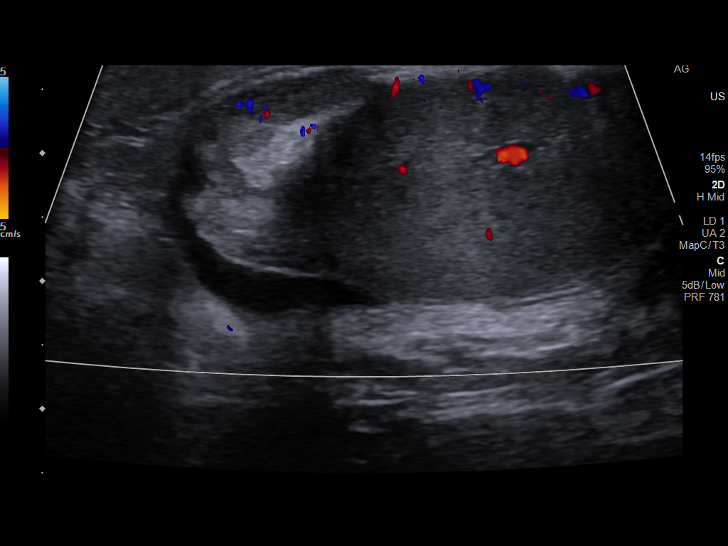
[im 46/91]
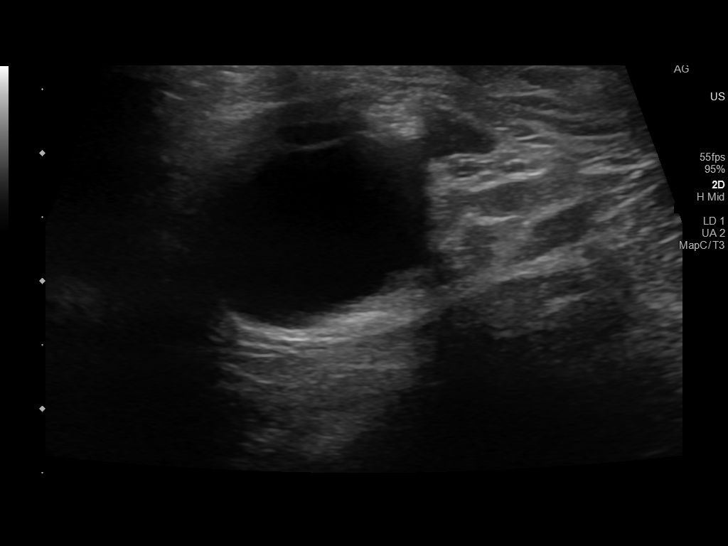
[im 53/91]
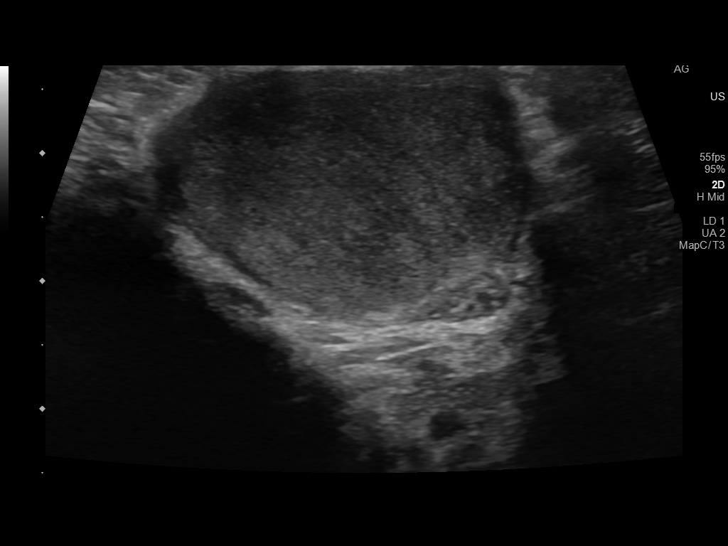
[im 61/91]
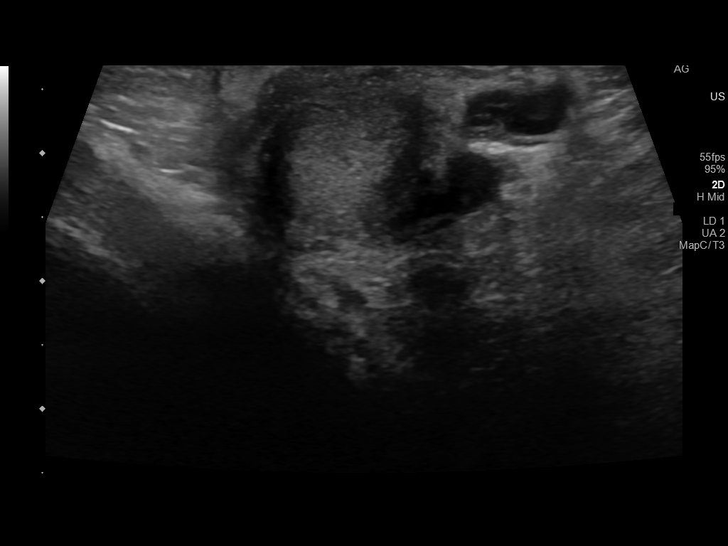
[im 68/91]
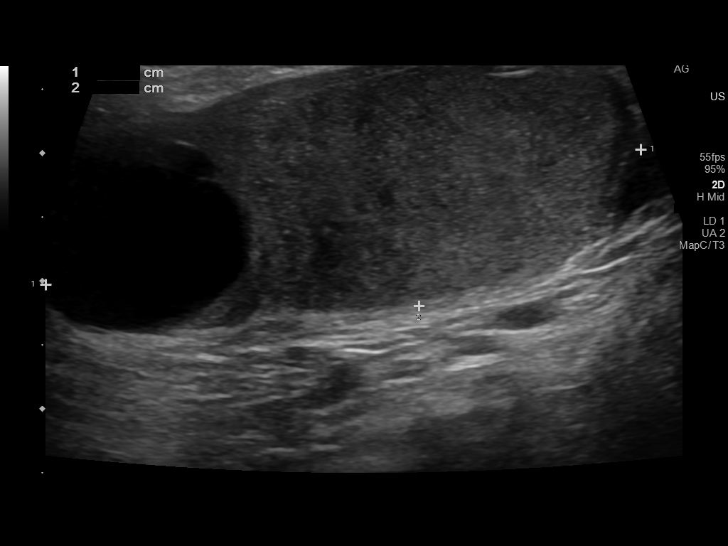
[im 76/91]
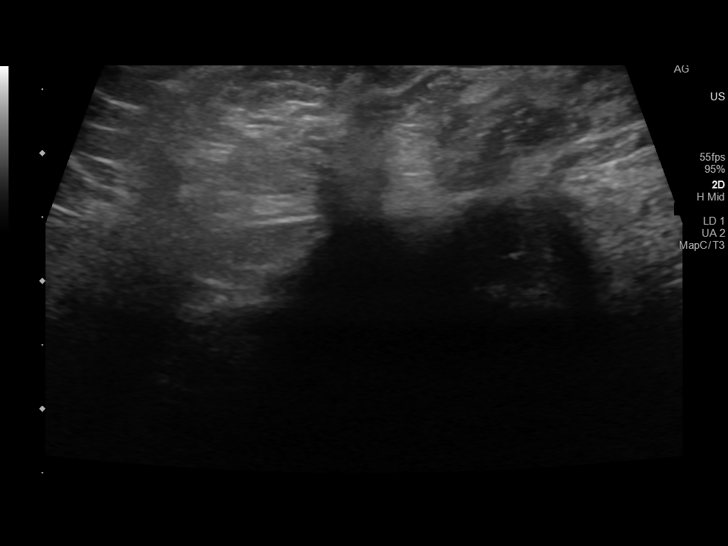
[im 83/91]
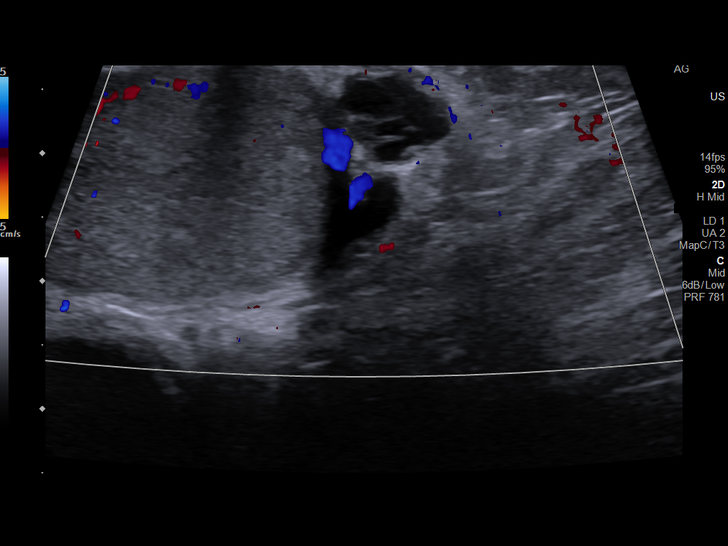
[im 91/91]
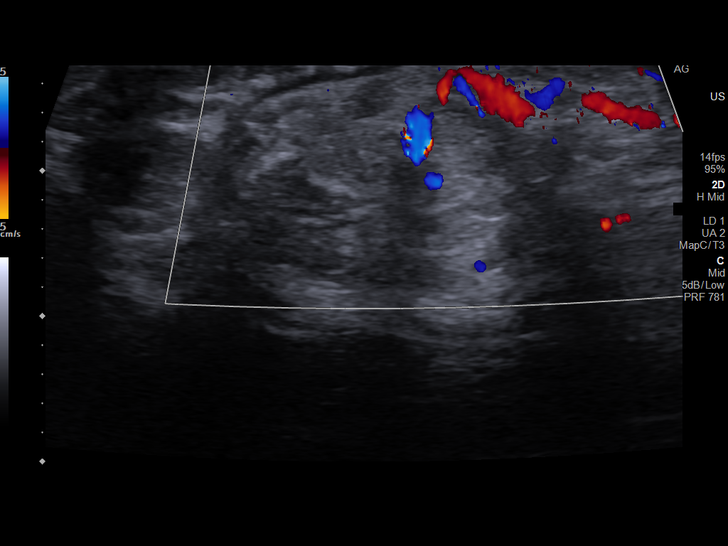

[13 of 25 positions shown; findings below may reference images not displayed]

FINDINGS: Right testicle

Measurements: 5.2 x 2.5 x 4.0 cm. No mass or microlithiasis
visualized.

Left testicle

Measurements: 4.8 x 2.0 x 2.8 cm. There is a thin-walled 2.0 x 1.6 x
1.9 cm anechoic lesion with posterior acoustic enhancement and no
internal vascularity within the superior left testicle.

Right epididymis:  Normal in size and appearance.

Left epididymis:  Normal in size and appearance.

Hydrocele:  Small right hydrocele.

Varicocele:  Left varicocele.

Pulsed Doppler interrogation of both testes demonstrates normal low
resistance arterial and venous waveforms bilaterally.
IMPRESSION: 1. Thin walled 2.0 cm cystic lesion without internal vascularity
within the left testicle favored to represent a testicular cyst.
Recommend follow-up ultrasound in 3 months to evaluate for interval
change.
2. Left varicocele.
3. Small right hydrocele.

## 2020-07-19 ENCOUNTER — Telehealth: Payer: Self-pay | Admitting: Nurse Practitioner

## 2020-07-19 DIAGNOSIS — M545 Low back pain, unspecified: Secondary | ICD-10-CM

## 2020-07-19 DIAGNOSIS — R399 Unspecified symptoms and signs involving the genitourinary system: Secondary | ICD-10-CM

## 2020-07-19 NOTE — Progress Notes (Signed)
Based on what you shared with me it looks like you have uti symptoms with back pain,that should be evaluated in a face to face office visit. Due to the associating back pain you will need a urinalysis and urine culture for proper treatment.    NOTE: If you entered your credit card information for this eVisit, you will not be charged. You may see a "hold" on your card for the $35 but that hold will drop off and you will not have a charge processed.  If you are having a true medical emergency please call 911.     For an urgent face to face visit, [facility] has four urgent care centers for your convenience:   . [facility]    [phone]                  Get Driving Directions  [address] [place], [place] [address] . 10 am to 8 pm Monday-Friday . 12 pm to 8 pm Saturday-Sunday   . [facility]  [phone]                  Get Driving Directions  [address], [address] [place], [place] [address] . 8 am to 8 pm Monday-Friday . 9 am to 6 pm Saturday . 11 am to 6 pm Sunday   . [facility]  [phone]                  Get Driving Directions   [address] [place], [place] [address] . 8 am to 8 pm Monday-Friday . 8 am to 4 pm Saturday-Sunday    . [facility]                    Get Driving Directions  [phone]  [address] [place], [place] [address]  . Monday-Friday, 12 PM to 6 PM    Your e-visit answers were reviewed by a board certified advanced clinical practitioner to complete your personal care plan.  Thank you for using e-Visits.  

## 2020-07-20 DIAGNOSIS — R300 Dysuria: Secondary | ICD-10-CM | POA: Diagnosis not present

## 2020-07-20 DIAGNOSIS — Z6831 Body mass index (BMI) 31.0-31.9, adult: Secondary | ICD-10-CM | POA: Diagnosis not present

## 2020-07-20 DIAGNOSIS — N3001 Acute cystitis with hematuria: Secondary | ICD-10-CM | POA: Diagnosis not present

## 2020-08-15 ENCOUNTER — Other Ambulatory Visit: Payer: Self-pay | Admitting: Family Medicine

## 2020-08-15 DIAGNOSIS — I10 Essential (primary) hypertension: Secondary | ICD-10-CM

## 2020-10-14 ENCOUNTER — Ambulatory Visit: Payer: BC Managed Care – PPO | Admitting: Family Medicine

## 2020-10-14 ENCOUNTER — Other Ambulatory Visit: Payer: Self-pay

## 2020-10-14 ENCOUNTER — Encounter: Payer: Self-pay | Admitting: Family Medicine

## 2020-10-14 VITALS — BP 141/88 | HR 80 | Ht 69.0 in | Wt 235.0 lb

## 2020-10-14 DIAGNOSIS — Z1211 Encounter for screening for malignant neoplasm of colon: Secondary | ICD-10-CM | POA: Diagnosis not present

## 2020-10-14 DIAGNOSIS — N410 Acute prostatitis: Secondary | ICD-10-CM | POA: Diagnosis not present

## 2020-10-14 DIAGNOSIS — I10 Essential (primary) hypertension: Secondary | ICD-10-CM

## 2020-10-14 DIAGNOSIS — Z23 Encounter for immunization: Secondary | ICD-10-CM | POA: Diagnosis not present

## 2020-10-14 DIAGNOSIS — Z13220 Encounter for screening for lipoid disorders: Secondary | ICD-10-CM

## 2020-10-14 MED ORDER — LEVOFLOXACIN 500 MG PO TABS: 500.0000 mg | ORAL_TABLET | Freq: Every day | ORAL | 0 refills | Status: DC

## 2020-10-14 MED ORDER — AMLODIPINE BESYLATE 10 MG PO TABS: 10.0000 mg | ORAL_TABLET | Freq: Every day | ORAL | 3 refills | Status: DC

## 2020-10-14 NOTE — Addendum Note (Signed)
Addended by: Arville Care on: 10/14/2020 02:40 PM   Modules accepted: Orders

## 2020-10-14 NOTE — Progress Notes (Signed)
BP (!) 141/88   Pulse 80   Ht '5\' 9"'  (1.753 m)   Wt 235 lb (106.6 kg)   SpO2 98%   BMI 34.70 kg/m    Subjective:   Patient ID: Christopher Gilmore, male    DOB: 1966-03-12, 55 y.o.   MRN: [id number]  HPI: Christopher Gilmore is a 55 y.o. male presenting on 10/14/2020 for Medical Management of Chronic Issues and Hypertension   HPI Hypertension Patient is currently on amlodipine, and their blood pressure today is 141/88. Patient denies any lightheadedness or dizziness. Patient denies headaches, blurred vision, chest pains, shortness of breath, or weakness. Denies any side effects from medication and is content with current medication.   Patient was diagnosed with prostatitis just over a month ago at the urgent care and did 2 weeks of levofloxacin which was finished not too long ago but now is starting to have issues again.  He says he started get dysuria again.  He denies any abdominal pain or flank pain.  Relevant past medical, surgical, family and social history reviewed and updated as indicated. Interim medical history since our last visit reviewed. Allergies and medications reviewed and updated.  Review of Systems  Constitutional: Negative for chills and fever.  Eyes: Negative for visual disturbance.  Respiratory: Negative for shortness of breath and wheezing.   Cardiovascular: Negative for chest pain and leg swelling.  Gastrointestinal: Negative for abdominal pain.  Genitourinary: Positive for dysuria. Negative for decreased urine volume, frequency, hematuria, testicular pain and urgency.  Musculoskeletal: Negative for back pain and gait problem.  Skin: Negative for rash.  Neurological: Negative for dizziness, weakness and light-headedness.  All other systems reviewed and are negative.   Per HPI unless specifically indicated above   Allergies as of 10/14/2020   No Known Allergies     Medication List       Accurate as of October 14, 2020  2:22 PM. If you have any questions, ask  your nurse or doctor.        STOP taking these medications   azithromycin 250 MG tablet Commonly known as: ZITHROMAX Stopped by: Fransisca Kaufmann Ameya Kutz, MD     TAKE these medications   amLODipine 10 MG tablet Commonly known as: NORVASC Take 1 tablet (10 mg total) by mouth daily. (Needs to be seen before next refill)        Objective:   BP (!) 141/88   Pulse 80   Ht '5\' 9"'  (1.753 m)   Wt 235 lb (106.6 kg)   SpO2 98%   BMI 34.70 kg/m   Wt Readings from Last 3 Encounters:  10/14/20 235 lb (106.6 kg)  09/30/18 230 lb (104.3 kg)  09/25/18 230 lb (104.3 kg)    Physical Exam Vitals and nursing note reviewed.  Constitutional:      General: He is not in acute distress.    Appearance: He is well-developed. He is not diaphoretic.  Eyes:     General: No scleral icterus.    Conjunctiva/sclera: Conjunctivae normal.  Neck:     Thyroid: No thyromegaly.  Cardiovascular:     Rate and Rhythm: Normal rate and regular rhythm.     Heart sounds: Normal heart sounds. No murmur heard.   Pulmonary:     Effort: Pulmonary effort is normal. No respiratory distress.     Breath sounds: Normal breath sounds. No wheezing.  Musculoskeletal:        General: No swelling.     Cervical back: Neck supple.  Lymphadenopathy:     Cervical: No cervical adenopathy.  Skin:    General: Skin is warm and dry.     Findings: No rash.  Neurological:     Mental Status: He is alert and oriented to person, place, and time.     Coordination: Coordination normal.  Psychiatric:        Behavior: Behavior normal.       Assessment & Plan:   Problem List Items Addressed This Visit      Cardiovascular and Mediastinum   Hypertension - Primary   Relevant Medications   amLODipine (NORVASC) 10 MG tablet   Other Relevant Orders   CBC with Differential/Platelet   Lipid panel    Other Visit Diagnoses    Essential hypertension       Relevant Medications   amLODipine (NORVASC) 10 MG tablet   Other Relevant  Orders   CBC with Differential/Platelet   CMP14+EGFR   Lipid panel   Lipid screening       Relevant Orders   CBC with Differential/Platelet   Lipid panel   Acute prostatitis       Relevant Medications   levofloxacin (LEVAQUIN) 500 MG tablet    Continue current blood pressure medication, amlodipine.  Seems to be doing well.  Patient was diagnosed with prostatitis due to weeks of Levaquin and it got better but then it started coming back and now is starting to have it again and having the dysuria.  He denies any flank pain or abdominal pain.  Patient declined physical today  Follow up plan: Return in about 1 year (around 10/14/2021), or if symptoms worsen or fail to improve, for Hypertension, physical.  Counseling provided for all of the vaccine components No orders of the defined types were placed in this encounter.   Caryl Pina, MD [facility] 10/14/2020, 2:22 PM

## 2020-10-14 NOTE — Addendum Note (Signed)
Addended by: Dorene Sorrow on: 10/14/2020 03:34 PM   Modules accepted: Orders

## 2020-10-15 LAB — LIPID PANEL
Chol/HDL Ratio: 5.7 ratio — ABNORMAL HIGH (ref 0.0–5.0)
Cholesterol, Total: 189 mg/dL (ref 100–199)
HDL: 33 mg/dL — ABNORMAL LOW (ref >39)
LDL Chol Calc (NIH): 110 mg/dL — ABNORMAL HIGH (ref 0–99)
Triglycerides: 266 mg/dL — ABNORMAL HIGH (ref 0–149)
VLDL Cholesterol Cal: 46 mg/dL — ABNORMAL HIGH (ref 5–40)

## 2020-10-15 LAB — CMP14+EGFR
ALT: 55 IU/L — ABNORMAL HIGH (ref 0–44)
AST: 82 IU/L — ABNORMAL HIGH (ref 0–40)
Albumin/Globulin Ratio: 1.7 (ref 1.2–2.2)
Albumin: 5 g/dL — ABNORMAL HIGH (ref 3.8–4.9)
Alkaline Phosphatase: 56 IU/L (ref 44–121)
BUN/Creatinine Ratio: 11 (ref 9–20)
BUN: 12 mg/dL (ref 6–24)
Bilirubin Total: 0.5 mg/dL (ref 0.0–1.2)
CO2: 22 mmol/L (ref 20–29)
Calcium: 9.6 mg/dL (ref 8.7–10.2)
Chloride: 101 mmol/L (ref 96–106)
Creatinine, Ser: 1.12 mg/dL (ref 0.76–1.27)
Globulin, Total: 3 g/dL (ref 1.5–4.5)
Glucose: 90 mg/dL (ref 65–99)
Potassium: 3.8 mmol/L (ref 3.5–5.2)
Sodium: 140 mmol/L (ref 134–144)
Total Protein: 8 g/dL (ref 6.0–8.5)
eGFR: 79 mL/min/{1.73_m2} (ref >59)

## 2020-10-15 LAB — CBC WITH DIFFERENTIAL/PLATELET
Basophils Absolute: 0.1 10*3/uL (ref 0.0–0.2)
Basos: 1 %
EOS (ABSOLUTE): 0.1 10*3/uL (ref 0.0–0.4)
Eos: 1 %
Hematocrit: 47.3 % (ref 37.5–51.0)
Hemoglobin: 16.7 g/dL (ref 13.0–17.7)
Immature Grans (Abs): 0 10*3/uL (ref 0.0–0.1)
Immature Granulocytes: 0 %
Lymphocytes Absolute: 2.9 10*3/uL (ref 0.7–3.1)
Lymphs: 28 %
MCH: 30.1 pg (ref 26.6–33.0)
MCHC: 35.3 g/dL (ref 31.5–35.7)
MCV: 85 fL (ref 79–97)
Monocytes Absolute: 0.6 10*3/uL (ref 0.1–0.9)
Monocytes: 6 %
Neutrophils Absolute: 6.6 10*3/uL (ref 1.4–7.0)
Neutrophils: 64 %
Platelets: 283 10*3/uL (ref 150–450)
RBC: 5.54 x10E6/uL (ref 4.14–5.80)
RDW: 13 % (ref 11.6–15.4)
WBC: 10.3 10*3/uL (ref 3.4–10.8)

## 2020-10-20 ENCOUNTER — Other Ambulatory Visit: Payer: Self-pay

## 2020-10-20 DIAGNOSIS — I10 Essential (primary) hypertension: Secondary | ICD-10-CM

## 2020-10-20 DIAGNOSIS — Z13220 Encounter for screening for lipoid disorders: Secondary | ICD-10-CM

## 2020-10-20 MED ORDER — ROSUVASTATIN CALCIUM 5 MG PO TABS: 5.0000 mg | ORAL_TABLET | Freq: Every evening | ORAL | 1 refills | Status: DC

## 2021-01-16 ENCOUNTER — Ambulatory Visit (INDEPENDENT_AMBULATORY_CARE_PROVIDER_SITE_OTHER): Payer: BC Managed Care – PPO | Admitting: Family

## 2021-01-16 ENCOUNTER — Encounter: Payer: Self-pay | Admitting: Family

## 2021-01-16 DIAGNOSIS — U071 COVID-19: Secondary | ICD-10-CM

## 2021-01-16 MED ORDER — NIRMATRELVIR/RITONAVIR (PAXLOVID)TABLET: 3.0000 | ORAL_TABLET | Freq: Two times a day (BID) | ORAL | 0 refills | Status: AC

## 2021-01-16 NOTE — Progress Notes (Signed)
   Virtual Visit  Note Due to COVID-19 pandemic this visit was conducted virtually. This visit type was conducted due to national recommendations for restrictions regarding the COVID-19 Pandemic (e.g. social distancing, sheltering in place) in an effort to limit this patient's exposure and mitigate transmission in our community. All issues noted in this document were discussed and addressed.  A physical exam was not performed with this format.  I connected with Christopher Gilmore on 01/16/21 at 2:53 pm  by telephone and verified that I am speaking with the correct person using two identifiers. Christopher Gilmore is currently located at home and no one is currently with him  during visit. The provider, Jannifer Rodney, FNP is located in their office at time of visit.  I discussed the limitations, risks, security and privacy concerns of performing an evaluation and management service by telephone and the availability of in person appointments. I also discussed with the patient that there may be a patient responsible charge related to this service. The patient expressed understanding and agreed to proceed.   History and Present Illness:  Pt calls with complaints of COVID. States his symptoms started on Sunday and tested positive for COVID.   Cough This is a new problem. The current episode started in the past 7 days. The problem has been gradually worsening. The problem occurs every few minutes. The cough is Non-productive. Associated symptoms include myalgias, nasal congestion, postnasal drip, rhinorrhea and wheezing. Pertinent negatives include no chills, ear congestion, ear pain, fever, headaches, sore throat or shortness of breath. Associated symptoms comments: diarrhea. He has tried rest (motrin) for the symptoms. The treatment provided mild relief.     Review of Systems  Constitutional:  Negative for chills and fever.  HENT:  Positive for postnasal drip and rhinorrhea. Negative for ear pain and sore  throat.   Respiratory:  Positive for cough and wheezing. Negative for shortness of breath.   Musculoskeletal:  Positive for myalgias.  Neurological:  Negative for headaches.    Observations/Objective: No SOB or distress noted, hoarse voice   Assessment and Plan: 1. COVID-19 virus detected COVID positive, rest, force fluids, tylenol as needed, Quarantine for at least 5 days and fever free, report any worsening symptoms such as increased shortness of breath, swelling, or continued high fevers.  - nirmatrelvir/ritonavir EUA (PAXLOVID) TABS; Take 3 tablets by mouth 2 (two) times daily for 5 days. (Take nirmatrelvir 150 mg two tablets twice daily for 5 days and ritonavir 100 mg one tablet twice daily for 5 days)  Dispense: 30 tablet; Refill: 0     I discussed the assessment and treatment plan with the patient. The patient was provided an opportunity to ask questions and all were answered. The patient agreed with the plan and demonstrated an understanding of the instructions.   The patient was advised to call back or seek an in-person evaluation if the symptoms worsen or if the condition fails to improve as anticipated.  The above assessment and management plan was discussed with the patient. The patient verbalized understanding of and has agreed to the management plan. Patient is aware to call the clinic if symptoms persist or worsen. Patient is aware when to return to the clinic for a follow-up visit. Patient educated on when it is appropriate to go to the emergency department.   Time call ended: 3:04 pm     I provided 11 minutes of  non face-to-face time during this encounter.    Jannifer Rodney, FNP

## 2021-02-22 ENCOUNTER — Telehealth: Payer: Self-pay | Admitting: Family Medicine

## 2021-02-22 NOTE — Telephone Encounter (Cosign Needed)
I spoke with Christopher Gilmore over the phone about his colorectal cancer screening. He had a referral earlier this year but did not get around to scheduling a colonoscopy. I discussed cologuard with him - explaining it is another options for screening and if the results are negative he would not need another one for 3 years. He is willing to try this at home. Could we order him a cologuard kit?

## 2021-05-25 ENCOUNTER — Other Ambulatory Visit: Payer: Self-pay | Admitting: Family Medicine

## 2021-06-21 ENCOUNTER — Encounter: Payer: Self-pay | Admitting: Nurse Practitioner

## 2021-06-21 ENCOUNTER — Ambulatory Visit: Payer: BC Managed Care – PPO | Admitting: Nurse Practitioner

## 2021-06-21 DIAGNOSIS — J0110 Acute frontal sinusitis, unspecified: Secondary | ICD-10-CM | POA: Diagnosis not present

## 2021-06-21 MED ORDER — FLUTICASONE PROPIONATE 50 MCG/ACT NA SUSP: 2.0000 | Freq: Every day | NASAL | 6 refills | Status: DC

## 2021-06-21 MED ORDER — AMOXICILLIN-POT CLAVULANATE 875-125 MG PO TABS: 1.0000 | ORAL_TABLET | Freq: Two times a day (BID) | ORAL | 0 refills | Status: DC

## 2021-06-21 NOTE — Progress Notes (Signed)
   Virtual Visit  Note Due to COVID-19 pandemic this visit was conducted virtually. This visit type was conducted due to national recommendations for restrictions regarding the COVID-19 Pandemic (e.g. social distancing, sheltering in place) in an effort to limit this patient's exposure and mitigate transmission in our community. All issues noted in this document were discussed and addressed.  A physical exam was not performed with this format.  I connected with Christopher Gilmore on 06/21/21 at 10:20 AM by telephone and verified that I am speaking with the correct person using two identifiers. Christopher Gilmore is currently located at home during visit. The provider, Daryll Drown, NP is located in their office at time of visit.  I discussed the limitations, risks, security and privacy concerns of performing an evaluation and management service by telephone and the availability of in person appointments. I also discussed with the patient that there may be a patient responsible charge related to this service. The patient expressed understanding and agreed to proceed.   History and Present Illness:  Sinusitis This is a recurrent problem. The current episode started in the past 7 days. The problem has been gradually worsening since onset. There has been no fever. The pain is moderate. Associated symptoms include congestion, coughing, ear pain, headaches and a sore throat. Pertinent negatives include no chills. Past treatments include saline sprays. The treatment provided no relief.     Review of Systems  Constitutional:  Negative for chills and fever.  HENT:  Positive for congestion, ear pain, sinus pain and sore throat.   Respiratory:  Positive for cough.   Neurological:  Positive for headaches.    Observations/Objective: Tele visit patient is not in distress  Assessment and Plan:  Take meds as prescribed - Use a cool mist humidifier  -Use saline nose sprays frequently -Force fluids -For fever  or aches or pains- take Tylenol or ibuprofen. -Augmentin 875-125 mg tablet by mouth. -If symptoms do not improve, she may need to be COVID tested to rule this out Follow up with worsening unresolved symptoms   Follow Up Instructions: Follow up with worsening  or unresolved symptoms    I discussed the assessment and treatment plan with the patient. The patient was provided an opportunity to ask questions and all were answered. The patient agreed with the plan and demonstrated an understanding of the instructions.   The patient was advised to call back or seek an in-person evaluation if the symptoms worsen or if the condition fails to improve as anticipated.  The above assessment and management plan was discussed with the patient. The patient verbalized understanding of and has agreed to the management plan. Patient is aware to call the clinic if symptoms persist or worsen. Patient is aware when to return to the clinic for a follow-up visit. Patient educated on when it is appropriate to go to the emergency department.   Time call ended: 10:28 AM  I provided 8 minutes of  non face-to-face time during this encounter.    Daryll Drown, NP

## 2021-06-21 NOTE — Assessment & Plan Note (Signed)
Take meds as prescribed - Use a cool mist humidifier  -Use saline nose sprays frequently -Force fluids -For fever or aches or pains- take Tylenol or ibuprofen. -Augmentin 875-125 mg tablet by mouth -If symptoms do not improve, she may need to be COVID tested to rule this out Follow up with worsening unresolved symptoms  

## 2021-06-21 NOTE — Patient Instructions (Signed)

## 2021-07-12 ENCOUNTER — Ambulatory Visit (INDEPENDENT_AMBULATORY_CARE_PROVIDER_SITE_OTHER): Payer: BC Managed Care – PPO | Admitting: Family Medicine

## 2021-07-12 ENCOUNTER — Encounter: Payer: Self-pay | Admitting: Family Medicine

## 2021-07-12 DIAGNOSIS — J329 Chronic sinusitis, unspecified: Secondary | ICD-10-CM

## 2021-07-12 MED ORDER — DOXYCYCLINE HYCLATE 100 MG PO TABS: 100.0000 mg | ORAL_TABLET | Freq: Two times a day (BID) | ORAL | 0 refills | Status: AC

## 2021-07-12 NOTE — Progress Notes (Signed)
Virtual Visit via telephone Note Due to COVID-19 pandemic this visit was conducted virtually. This visit type was conducted due to national recommendations for restrictions regarding the COVID-19 Pandemic (e.g. social distancing, sheltering in place) in an effort to limit this patient's exposure and mitigate transmission in our community. All issues noted in this document were discussed and addressed.  A physical exam was not performed with this format.   I connected with Christopher Gilmore on 07/12/2021 at 1315 by telephone and verified that I am speaking with the correct person using two identifiers. Christopher Gilmore is currently located at home and patient is currently with them during visit. The provider, Kari Baars, FNP is located in their office at time of visit.  I discussed the limitations, risks, security and privacy concerns of performing an evaluation and management service by telephone and the availability of in person appointments. I also discussed with the patient that there may be a patient responsible charge related to this service. The patient expressed understanding and agreed to proceed.  Subjective:  Patient ID: Christopher Gilmore, male    DOB: Oct 05, 1965, 55 y.o.   MRN: [id number]  Chief Complaint:  Sinus Problem   HPI: [name] is a 55 y.o. male presenting on 07/12/2021 for Sinus Problem   Pt reports recurrent sinusitis symptoms. He was recently treated with a 7 day course of Augmentin. States he felt better for 2 days and then symptoms returned and seem to be worsening. He has been taking Flonase daily.   Sinus Problem This is a recurrent problem. The current episode started 1 to 4 weeks ago. The problem has been gradually worsening since onset. His pain is at a severity of 5/10. The pain is moderate. Associated symptoms include congestion, coughing, headaches and sinus pressure. Pertinent negatives include no chills, diaphoresis, ear pain, hoarse voice, neck pain, shortness of  breath, sneezing, sore throat or swollen glands. Past treatments include antibiotics and spray decongestants. The treatment provided mild relief.    Relevant past medical, surgical, family, and social history reviewed and updated as indicated.  Allergies and medications reviewed and updated.   Past Medical History:  Diagnosis Date   Gout     History reviewed. No pertinent surgical history.  Social History   Socioeconomic History   Marital status: Married    Spouse name: Not on file   Number of children: Not on file   Years of education: Not on file   Highest education level: Not on file  Occupational History   Not on file  Tobacco Use   Smoking status: Never   Smokeless tobacco: Current    Types: Snuff  Substance and Sexual Activity   Alcohol use: Yes    Comment: very rare   Drug use: No   Sexual activity: Not on file  Other Topics Concern   Not on file  Social History Narrative   Not on file   Social Determinants of Health   Financial Resource Strain: Not on file  Food Insecurity: Not on file  Transportation Needs: Not on file  Physical Activity: Not on file  Stress: Not on file  Social Connections: Not on file  Intimate Partner Violence: Not on file    Outpatient Encounter Medications as of 07/12/2021  Medication Sig   doxycycline (VIBRA-TABS) 100 MG tablet Take 1 tablet (100 mg total) by mouth 2 (two) times daily for 10 days. 1 po bid   amLODipine (NORVASC) 10 MG tablet Take 1 tablet (  10 mg total) by mouth daily. (Needs to be seen before next refill)   fluticasone (FLONASE) 50 MCG/ACT nasal spray Place 2 sprays into both nostrils daily.   rosuvastatin (CRESTOR) 5 MG tablet TAKE ONE TABLET BY MOUTH AT BEDTIME   [DISCONTINUED] amoxicillin-clavulanate (AUGMENTIN) 875-125 MG tablet Take 1 tablet by mouth 2 (two) times daily.   No facility-administered encounter medications on file as of 07/12/2021.    No Known Allergies  Review of Systems  Constitutional:   Negative for activity change, appetite change, chills, diaphoresis, fatigue, fever and unexpected weight change.  HENT:  Positive for congestion, postnasal drip, rhinorrhea, sinus pressure and sinus pain. Negative for ear pain, hoarse voice, sneezing and sore throat.   Respiratory:  Positive for cough. Negative for shortness of breath.   Genitourinary:  Negative for decreased urine volume.  Musculoskeletal:  Negative for neck pain.  Neurological:  Positive for headaches. Negative for dizziness, tremors, seizures, syncope, facial asymmetry, speech difficulty, weakness, light-headedness and numbness.  All other systems reviewed and are negative.       Observations/Objective: No vital signs or physical exam, this was a telephone or virtual health encounter.  Pt alert and oriented, answers all questions appropriately, and able to speak in full sentences.    Assessment and Plan: Christopher Gilmore was seen today for sinus problem.  Diagnoses and all orders for this visit:  Recurrent sinusitis Recurrent symptoms post Augmentin therapy. Will initiate doxycycline. Continue Flonase and add Mucinex twice daily with plenty of water. Report any new, worsening, or persistent symptoms.  -     doxycycline (VIBRA-TABS) 100 MG tablet; Take 1 tablet (100 mg total) by mouth 2 (two) times daily for 10 days. 1 po bid    Follow Up Instructions: Return if symptoms worsen or fail to improve.    I discussed the assessment and treatment plan with the patient. The patient was provided an opportunity to ask questions and all were answered. The patient agreed with the plan and demonstrated an understanding of the instructions.   The patient was advised to call back or seek an in-person evaluation if the symptoms worsen or if the condition fails to improve as anticipated.  The above assessment and management plan was discussed with the patient. The patient verbalized understanding of and has agreed to the management plan.  Patient is aware to call the clinic if they develop any new symptoms or if symptoms persist or worsen. Patient is aware when to return to the clinic for a follow-up visit. Patient educated on when it is appropriate to go to the emergency department.    I provided 11 minutes of non-face-to-face time during this encounter. The call started at 1315. The call ended at 1325. The other time was used for coordination of care.    Kari Baars, FNP-C [facility] [address] [place], [place] [address] [phone] 07/12/2021

## 2021-10-13 ENCOUNTER — Encounter: Payer: Self-pay | Admitting: Family Medicine

## 2021-10-13 ENCOUNTER — Ambulatory Visit: Payer: BC Managed Care – PPO | Admitting: Family Medicine

## 2021-10-13 VITALS — BP 148/94 | HR 71 | Ht 69.0 in | Wt 232.0 lb

## 2021-10-13 DIAGNOSIS — B372 Candidiasis of skin and nail: Secondary | ICD-10-CM

## 2021-10-13 NOTE — Progress Notes (Signed)
? ?BP (!) 148/94   Pulse 71   Ht 5\' 9"  (1.753 m)   Wt 232 lb (105.2 kg)   SpO2 97%   BMI 34.26 kg/m?   ? ?Subjective:  ? ?Patient ID: Christopher Gilmore, male    DOB: Nov 19, 1965, 56 y.o.   MRN: [id number] ? ?HPI: ?Christopher Gilmore is a 56 y.o. male presenting on 10/13/2021 for Rectal Pain (And irritation) ? ? ?HPI ?Patient is coming in for rash that has developed in the groin near his buttocks and in between his legs.  He says its been irritating him over the past few weeks.  He states that he has been sleeping a little bit more on the couch and has developed this rash and thinks it is more from a heat rash.  He does try washing the child and gets more irritated afterwards and feels dry.  He did see a nurse at work who recommended an antifungal cream but when he got the appointment wanted to come in to get it checked out before starting something. ? ?Relevant past medical, surgical, family and social history reviewed and updated as indicated. Interim medical history since our last visit reviewed. ?Allergies and medications reviewed and updated. ? ?Review of Systems  ?Constitutional:  Negative for appetite change.  ?Skin:  Positive for rash. Negative for color change.  ? ?Per HPI unless specifically indicated above ? ? ?Allergies as of 10/13/2021   ?No Known Allergies ?  ? ?  ?Medication List  ?  ? ?  ? Accurate as of October 13, 2021  3:57 PM. If you have any questions, ask your nurse or doctor.  ?  ?  ? ?  ? ?amLODipine 10 MG tablet ?Commonly known as: NORVASC ?Take 1 tablet (10 mg total) by mouth daily. (Needs to be seen before next refill) ?  ?fluticasone 50 MCG/ACT nasal spray ?Commonly known as: FLONASE ?Place 2 sprays into both nostrils daily. ?  ?rosuvastatin 5 MG tablet ?Commonly known as: CRESTOR ?TAKE ONE TABLET BY MOUTH AT BEDTIME ?  ? ?  ? ? ? ?Objective:  ? ?BP (!) 148/94   Pulse 71   Ht 5\' 9"  (1.753 m)   Wt 232 lb (105.2 kg)   SpO2 97%   BMI 34.26 kg/m?   ?Wt Readings from Last 3 Encounters:  ?10/13/21  232 lb (105.2 kg)  ?10/14/20 235 lb (106.6 kg)  ?09/30/18 230 lb (104.3 kg)  ?  ?Physical Exam ?Vitals and nursing note reviewed.  ?Constitutional:   ?   General: He is not in acute distress. ?   Appearance: He is well-developed. He is not diaphoretic.  ?Eyes:  ?   General: No scleral icterus. ?Skin: ?   General: Skin is warm and dry.  ?   Findings: Rash (Irritated papular rash on her buttocks and into her groin posteriorly.  Satellite lesions, consistent with yeast dermatitis) present.  ?Neurological:  ?   Mental Status: He is alert and oriented to person, place, and time.  ?   Coordination: Coordination normal.  ?Psychiatric:     ?   Behavior: Behavior normal.  ? ? ? ? ?Assessment & Plan:  ? ?Problem List Items Addressed This Visit   ?None ?Visit Diagnoses   ? ? Yeast dermatitis    -  Primary  ? ?  ?  ?Patient says he already has antifungal cream at home, thinks it is Lotrimin, recommended to do that twice a day ?Follow up plan: ?Return if  symptoms worsen or fail to improve. ? ?Counseling provided for all of the vaccine components ?No orders of the defined types were placed in this encounter. ? ? ?Arville Care, MD ?[facility] ?10/13/2021, 3:57 PM ? ? ?  ?

## 2021-10-16 ENCOUNTER — Encounter: Payer: BC Managed Care – PPO | Admitting: Family Medicine

## 2022-01-19 ENCOUNTER — Other Ambulatory Visit: Payer: Self-pay | Admitting: Family Medicine

## 2022-01-19 DIAGNOSIS — I10 Essential (primary) hypertension: Secondary | ICD-10-CM

## 2022-03-16 ENCOUNTER — Other Ambulatory Visit: Payer: Self-pay | Admitting: *Deleted

## 2022-03-16 ENCOUNTER — Telehealth: Payer: Self-pay | Admitting: Family Medicine

## 2022-03-16 DIAGNOSIS — I10 Essential (primary) hypertension: Secondary | ICD-10-CM

## 2022-03-16 MED ORDER — AMLODIPINE BESYLATE 10 MG PO TABS: 10.0000 mg | ORAL_TABLET | Freq: Every day | ORAL | 0 refills | Status: DC

## 2022-03-16 NOTE — Telephone Encounter (Signed)
Pt called stating that he needs refill on his BP medicine and wants to know if Dr Dettinger can send him in a refill? Says he is on vacation right now and wont be back until 03/26/22. Also requesting to schedule an appt with Dr Dettinger for check up. Offered him Dr Oris Drone first available which is 04/19/22 but pt wants to be seen sooner.  Please advise and call patient.

## 2022-03-16 NOTE — Telephone Encounter (Signed)
Patient leaving on vacation tomorrow needs B/P meds.  Consulted clinical Building control surveyor.  She gave okay for one month supply.  Contacted patient and notified.  Advised patient to make an appt when he gets back from his vacation

## 2022-03-22 ENCOUNTER — Ambulatory Visit: Payer: BC Managed Care – PPO | Admitting: Family Medicine

## 2022-03-22 ENCOUNTER — Encounter: Payer: Self-pay | Admitting: Family Medicine

## 2022-03-22 VITALS — BP 137/81 | HR 70 | Temp 98.3°F | Ht 69.0 in | Wt 225.0 lb

## 2022-03-22 DIAGNOSIS — S61211A Laceration without foreign body of left index finger without damage to nail, initial encounter: Secondary | ICD-10-CM

## 2022-03-22 NOTE — Progress Notes (Signed)
Subjective:  Patient ID: Christopher Gilmore, male    DOB: 17-Aug-1965, 56 y.o.   MRN: [id number]  Patient Care Team: Dettinger, Fransisca Kaufmann, MD as PCP - General (Family Medicine)   Chief Complaint:  Laceration (Finger)   HPI: Christopher Gilmore is a 56 y.o. male presenting on 03/22/2022 for Laceration (Finger)   Pt presents today with laceration to left index finger. Happened yesterday evening. Cleaned wound thoroughly and applied bandage at home.  Laceration  The incident occurred 12 to 24 hours ago. The laceration is located on the Left hand. The laceration is 3 cm in size. The laceration mechanism was a metal edge. The pain is at a severity of 1/10. The pain is mild. The pain has been Fluctuating since onset. He reports no foreign bodies present. His tetanus status is UTD.    Relevant past medical, surgical, family, and social history reviewed and updated as indicated.  Allergies and medications reviewed and updated. Data reviewed: Chart in Epic.   Past Medical History:  Diagnosis Date   Gout     History reviewed. No pertinent surgical history.  Social History   Socioeconomic History   Marital status: Married    Spouse name: Not on file   Number of children: Not on file   Years of education: Not on file   Highest education level: Not on file  Occupational History   Not on file  Tobacco Use   Smoking status: Never   Smokeless tobacco: Current    Types: Snuff  Substance and Sexual Activity   Alcohol use: Yes    Comment: very rare   Drug use: No   Sexual activity: Not on file  Other Topics Concern   Not on file  Social History Narrative   Not on file   Social Determinants of Health   Financial Resource Strain: Not on file  Food Insecurity: Not on file  Transportation Needs: Not on file  Physical Activity: Not on file  Stress: Not on file  Social Connections: Not on file  Intimate Partner Violence: Not on file    Outpatient Encounter Medications as of 03/22/2022   Medication Sig   amLODipine (NORVASC) 10 MG tablet Take 1 tablet (10 mg total) by mouth daily.   fluticasone (FLONASE) 50 MCG/ACT nasal spray Place 2 sprays into both nostrils daily.   rosuvastatin (CRESTOR) 5 MG tablet TAKE ONE TABLET BY MOUTH AT BEDTIME   [DISCONTINUED] colchicine 0.6 MG tablet Take 0.6 mg by mouth daily. (Patient not taking: Reported on 03/22/2022)   No facility-administered encounter medications on file as of 03/22/2022.    No Known Allergies  Review of Systems  Constitutional:  Negative for chills and fever.  Skin:  Positive for wound.  All other systems reviewed and are negative.       Objective:  BP 137/81   Pulse 70   Temp 98.3 F (36.8 C)   Ht '5\' 9"'  (1.753 m)   Wt 225 lb (102.1 kg)   SpO2 97%   BMI 33.23 kg/m    Wt Readings from Last 3 Encounters:  03/22/22 225 lb (102.1 kg)  10/13/21 232 lb (105.2 kg)  10/14/20 235 lb (106.6 kg)    Physical Exam Vitals and nursing note reviewed.  Constitutional:      General: He is not in acute distress.    Appearance: Normal appearance. He is obese. He is not ill-appearing, toxic-appearing or diaphoretic.  HENT:     Head: Normocephalic and  atraumatic.     Mouth/Throat:     Mouth: Mucous membranes are moist.  Eyes:     Pupils: Pupils are equal, round, and reactive to light.  Cardiovascular:     Rate and Rhythm: Normal rate and regular rhythm.     Pulses: Normal pulses.     Heart sounds: Normal heart sounds.  Pulmonary:     Effort: Pulmonary effort is normal.     Breath sounds: Normal breath sounds.  Musculoskeletal:        General: Normal range of motion.  Skin:    General: Skin is warm and dry.     Capillary Refill: Capillary refill takes less than 2 seconds.     Findings: Laceration present.       Neurological:     General: No focal deficit present.     Mental Status: He is alert and oriented to person, place, and time.  Psychiatric:        Mood and Affect: Mood normal.        Behavior:  Behavior normal.        Thought Content: Thought content normal.        Judgment: Judgment normal.     Results for orders placed or performed in visit on 10/14/20  CBC with Differential/Platelet  Result Value Ref Range   WBC 10.3 3.4 - 10.8 x10E3/uL   RBC 5.54 4.14 - 5.80 x10E6/uL   Hemoglobin 16.7 13.0 - 17.7 g/dL   Hematocrit 47.3 37.5 - 51.0 %   MCV 85 79 - 97 fL   MCH 30.1 26.6 - 33.0 pg   MCHC 35.3 31.5 - 35.7 g/dL   RDW 13.0 11.6 - 15.4 %   Platelets 283 150 - 450 x10E3/uL   Neutrophils 64 Not Estab. %   Lymphs 28 Not Estab. %   Monocytes 6 Not Estab. %   Eos 1 Not Estab. %   Basos 1 Not Estab. %   Neutrophils Absolute 6.6 1.4 - 7.0 x10E3/uL   Lymphocytes Absolute 2.9 0.7 - 3.1 x10E3/uL   Monocytes Absolute 0.6 0.1 - 0.9 x10E3/uL   EOS (ABSOLUTE) 0.1 0.0 - 0.4 x10E3/uL   Basophils Absolute 0.1 0.0 - 0.2 x10E3/uL   Immature Granulocytes 0 Not Estab. %   Immature Grans (Abs) 0.0 0.0 - 0.1 x10E3/uL  CMP14+EGFR  Result Value Ref Range   Glucose 90 65 - 99 mg/dL   BUN 12 6 - 24 mg/dL   Creatinine, Ser 1.12 0.76 - 1.27 mg/dL   eGFR 79 >59 mL/min/1.73   BUN/Creatinine Ratio 11 9 - 20   Sodium 140 134 - 144 mmol/L   Potassium 3.8 3.5 - 5.2 mmol/L   Chloride 101 96 - 106 mmol/L   CO2 22 20 - 29 mmol/L   Calcium 9.6 8.7 - 10.2 mg/dL   Total Protein 8.0 6.0 - 8.5 g/dL   Albumin 5.0 (H) 3.8 - 4.9 g/dL   Globulin, Total 3.0 1.5 - 4.5 g/dL   Albumin/Globulin Ratio 1.7 1.2 - 2.2   Bilirubin Total 0.5 0.0 - 1.2 mg/dL   Alkaline Phosphatase 56 44 - 121 IU/L   AST 82 (H) 0 - 40 IU/L   ALT 55 (H) 0 - 44 IU/L  Lipid panel  Result Value Ref Range   Cholesterol, Total 189 100 - 199 mg/dL   Triglycerides 266 (H) 0 - 149 mg/dL   HDL 33 (L) >39 mg/dL   VLDL Cholesterol Cal 46 (H) 5 - 40 mg/dL  LDL Chol Calc (NIH) 110 (H) 0 - 99 mg/dL   Chol/HDL Ratio 5.7 (H) 0.0 - 5.0 ratio       Pertinent labs & imaging results that were available during my care of the patient were  reviewed by me and considered in my medical decision making.  Assessment & Plan:  Eryk was seen today for laceration.  Diagnoses and all orders for this visit:  Laceration of left index finger without foreign body without damage to nail, initial encounter Unable to suture as injury occurred more than 12 hours ago. Wound cleansed thoroughly, no FB noted. ROM WNL. No drainage or surrounding erythema. Steri strips and bandage applied. Pt aware of red flags. Wound care discussed in detail. Pt aware to report new, worsening, or persistent symptoms.  -     Apply steri strips     Continue all other maintenance medications.  Follow up plan: Return if symptoms worsen or fail to improve.   Continue healthy lifestyle choices, including diet (rich in fruits, vegetables, and lean proteins, and low in salt and simple carbohydrates) and exercise (at least 30 minutes of moderate physical activity daily).  Educational handout given for laceration  The above assessment and management plan was discussed with the patient. The patient verbalized understanding of and has agreed to the management plan. Patient is aware to call the clinic if they develop any new symptoms or if symptoms persist or worsen. Patient is aware when to return to the clinic for a follow-up visit. Patient educated on when it is appropriate to go to the emergency department.   Monia Pouch, FNP-C [facility] [phone]

## 2022-03-27 ENCOUNTER — Telehealth: Payer: Self-pay | Admitting: Family Medicine

## 2022-03-27 ENCOUNTER — Other Ambulatory Visit: Payer: Self-pay | Admitting: Family Medicine

## 2022-03-27 DIAGNOSIS — L03012 Cellulitis of left finger: Secondary | ICD-10-CM

## 2022-03-27 MED ORDER — CEPHALEXIN 500 MG PO CAPS: 500.0000 mg | ORAL_CAPSULE | Freq: Four times a day (QID) | ORAL | 0 refills | Status: AC

## 2022-03-27 NOTE — Telephone Encounter (Signed)
Marcelino Duster,  Does the pt need to return or do you want to send in ATB? You seen him on 8/17 for finger laceration. No sutures. He does not have a follow  up scheduled for this. Next appt in Sept with Dettinger to reck BP.

## 2022-03-27 NOTE — Telephone Encounter (Signed)
Pt informed that Marcelino Duster has sent in Keflex for him.

## 2022-04-23 ENCOUNTER — Encounter: Payer: Self-pay | Admitting: Family Medicine

## 2022-04-23 ENCOUNTER — Ambulatory Visit: Payer: BC Managed Care – PPO | Admitting: Family Medicine

## 2022-04-23 VITALS — BP 139/81 | HR 93 | Temp 97.4°F | Ht 69.0 in | Wt 229.0 lb

## 2022-04-23 DIAGNOSIS — Z23 Encounter for immunization: Secondary | ICD-10-CM | POA: Diagnosis not present

## 2022-04-23 DIAGNOSIS — I10 Essential (primary) hypertension: Secondary | ICD-10-CM

## 2022-04-23 DIAGNOSIS — E785 Hyperlipidemia, unspecified: Secondary | ICD-10-CM | POA: Diagnosis not present

## 2022-04-23 MED ORDER — ROSUVASTATIN CALCIUM 5 MG PO TABS: 5.0000 mg | ORAL_TABLET | Freq: Every day | ORAL | 3 refills | Status: DC

## 2022-04-23 MED ORDER — AMLODIPINE BESYLATE 10 MG PO TABS: 10.0000 mg | ORAL_TABLET | Freq: Every day | ORAL | 3 refills | Status: DC

## 2022-04-23 NOTE — Progress Notes (Signed)
BP 139/81   Pulse 93   Temp (!) 97.4 F (36.3 C)   Ht '5\' 9"'  (1.753 m)   Wt 229 lb (103.9 kg)   SpO2 98%   BMI 33.82 kg/m    Subjective:   Patient ID: Christopher Gilmore, Christopher Gilmore    DOB: 1965-11-09, 56 y.o.   MRN: [id number]  HPI: Christopher Gilmore is a 56 y.o. Christopher Gilmore presenting on 04/23/2022 for Medical Management of Chronic Issues and Hypertension   HPI Hypertension Patient is currently on amlodipine, and their blood pressure today is 139/81. Patient denies any lightheadedness or dizziness. Patient denies headaches, blurred vision, chest pains, shortness of breath, or weakness. Denies any side effects from medication and is content with current medication.   Hyperlipidemia Patient is coming in for recheck of his hyperlipidemia. The patient is currently taking Crestor. They deny any issues with myalgias or history of liver damage from it. They deny any focal numbness or weakness or chest pain.   Relevant past medical, surgical, family and social history reviewed and updated as indicated. Interim medical history since our last visit reviewed. Allergies and medications reviewed and updated.  Review of Systems  Constitutional:  Negative for chills and fever.  Eyes:  Negative for visual disturbance.  Respiratory:  Negative for shortness of breath and wheezing.   Cardiovascular:  Negative for chest pain and leg swelling.  Musculoskeletal:  Negative for back pain and gait problem.  Skin:  Negative for rash.  Neurological:  Negative for dizziness, weakness and light-headedness.  All other systems reviewed and are negative.   Per HPI unless specifically indicated above   Allergies as of 04/23/2022   No Known Allergies      Medication List        Accurate as of April 23, 2022  3:41 PM. If you have any questions, ask your nurse or doctor.          amLODipine 10 MG tablet Commonly known as: NORVASC Take 1 tablet (10 mg total) by mouth daily.   fluticasone 50 MCG/ACT nasal  spray Commonly known as: FLONASE Place 2 sprays into both nostrils daily.   rosuvastatin 5 MG tablet Commonly known as: CRESTOR Take 1 tablet (5 mg total) by mouth at bedtime.         Objective:   BP 139/81   Pulse 93   Temp (!) 97.4 F (36.3 C)   Ht '5\' 9"'  (1.753 m)   Wt 229 lb (103.9 kg)   SpO2 98%   BMI 33.82 kg/m   Wt Readings from Last 3 Encounters:  04/23/22 229 lb (103.9 kg)  03/22/22 225 lb (102.1 kg)  10/13/21 232 lb (105.2 kg)    Physical Exam Vitals and nursing note reviewed.  Constitutional:      General: He is not in acute distress.    Appearance: He is well-developed. He is not diaphoretic.  Eyes:     General: No scleral icterus.    Conjunctiva/sclera: Conjunctivae normal.  Neck:     Thyroid: No thyromegaly.  Cardiovascular:     Rate and Rhythm: Normal rate and regular rhythm.     Heart sounds: Normal heart sounds. No murmur heard. Pulmonary:     Effort: Pulmonary effort is normal. No respiratory distress.     Breath sounds: Normal breath sounds. No wheezing.  Musculoskeletal:        General: Normal range of motion.     Cervical back: Neck supple.  Lymphadenopathy:  Cervical: No cervical adenopathy.  Skin:    General: Skin is warm and dry.     Findings: No rash.  Neurological:     Mental Status: He is alert and oriented to person, place, and time.     Coordination: Coordination normal.  Psychiatric:        Behavior: Behavior normal.       Assessment & Plan:   Problem List Items Addressed This Visit       Cardiovascular and Mediastinum   Hypertension - Primary   Relevant Medications   amLODipine (NORVASC) 10 MG tablet   rosuvastatin (CRESTOR) 5 MG tablet   Other Relevant Orders   CBC with Differential/Platelet   CMP14+EGFR   Lipid panel     Other   Dyslipidemia   Relevant Medications   rosuvastatin (CRESTOR) 5 MG tablet   Other Relevant Orders   CBC with Differential/Platelet   CMP14+EGFR   Lipid panel    Continue  current medicine, blood pressure looks good, no changes. Follow up plan: Return in about 6 months (around 10/22/2022), or if symptoms worsen or fail to improve, for Physical exam and hypertension and cholesterol.  Counseling provided for all of the vaccine components Orders Placed This Encounter  Procedures   CBC with Differential/Platelet   CMP14+EGFR   Lipid panel    Caryl Pina, MD [facility] 04/23/2022, 3:41 PM

## 2022-04-24 LAB — CBC WITH DIFFERENTIAL/PLATELET
Basophils Absolute: 0.1 10*3/uL (ref 0.0–0.2)
Basos: 1 %
EOS (ABSOLUTE): 0.2 10*3/uL (ref 0.0–0.4)
Eos: 2 %
Hematocrit: 48.1 % (ref 37.5–51.0)
Hemoglobin: 16.6 g/dL (ref 13.0–17.7)
Immature Grans (Abs): 0 10*3/uL (ref 0.0–0.1)
Immature Granulocytes: 0 %
Lymphocytes Absolute: 2.5 10*3/uL (ref 0.7–3.1)
Lymphs: 29 %
MCH: 29.3 pg (ref 26.6–33.0)
MCHC: 34.5 g/dL (ref 31.5–35.7)
MCV: 85 fL (ref 79–97)
Monocytes Absolute: 0.7 10*3/uL (ref 0.1–0.9)
Monocytes: 8 %
Neutrophils Absolute: 5.2 10*3/uL (ref 1.4–7.0)
Neutrophils: 60 %
Platelets: 256 10*3/uL (ref 150–450)
RBC: 5.66 x10E6/uL (ref 4.14–5.80)
RDW: 13.1 % (ref 11.6–15.4)
WBC: 8.7 10*3/uL (ref 3.4–10.8)

## 2022-04-24 LAB — LIPID PANEL
Chol/HDL Ratio: 4.3 ratio (ref 0.0–5.0)
Cholesterol, Total: 156 mg/dL (ref 100–199)
HDL: 36 mg/dL — ABNORMAL LOW (ref >39)
LDL Chol Calc (NIH): 67 mg/dL (ref 0–99)
Triglycerides: 336 mg/dL — ABNORMAL HIGH (ref 0–149)
VLDL Cholesterol Cal: 53 mg/dL — ABNORMAL HIGH (ref 5–40)

## 2022-04-24 LAB — CMP14+EGFR
ALT: 29 IU/L (ref 0–44)
AST: 53 IU/L — ABNORMAL HIGH (ref 0–40)
Albumin/Globulin Ratio: 1.6 (ref 1.2–2.2)
Albumin: 4.9 g/dL (ref 3.8–4.9)
Alkaline Phosphatase: 57 IU/L (ref 44–121)
BUN/Creatinine Ratio: 11 (ref 9–20)
BUN: 13 mg/dL (ref 6–24)
Bilirubin Total: 0.3 mg/dL (ref 0.0–1.2)
CO2: 21 mmol/L (ref 20–29)
Calcium: 9.9 mg/dL (ref 8.7–10.2)
Chloride: 101 mmol/L (ref 96–106)
Creatinine, Ser: 1.14 mg/dL (ref 0.76–1.27)
Globulin, Total: 3 g/dL (ref 1.5–4.5)
Glucose: 95 mg/dL (ref 70–99)
Potassium: 4.2 mmol/L (ref 3.5–5.2)
Sodium: 138 mmol/L (ref 134–144)
Total Protein: 7.9 g/dL (ref 6.0–8.5)
eGFR: 76 mL/min/{1.73_m2} (ref >59)

## 2022-07-11 ENCOUNTER — Other Ambulatory Visit: Payer: Self-pay

## 2022-07-11 ENCOUNTER — Telehealth: Payer: Self-pay | Admitting: Family Medicine

## 2022-07-11 ENCOUNTER — Ambulatory Visit: Payer: BC Managed Care – PPO | Admitting: Nurse Practitioner

## 2022-07-11 ENCOUNTER — Encounter: Payer: Self-pay | Admitting: Nurse Practitioner

## 2022-07-11 DIAGNOSIS — U071 COVID-19: Secondary | ICD-10-CM | POA: Diagnosis not present

## 2022-07-11 MED ORDER — GUAIFENESIN ER 600 MG PO TB12: 600.0000 mg | ORAL_TABLET | Freq: Two times a day (BID) | ORAL | 0 refills | Status: AC

## 2022-07-11 MED ORDER — NIRMATRELVIR/RITONAVIR (PAXLOVID)TABLET: 3.0000 | ORAL_TABLET | Freq: Two times a day (BID) | ORAL | 0 refills | Status: DC

## 2022-07-11 MED ORDER — FLUTICASONE PROPIONATE 50 MCG/ACT NA SUSP: 2.0000 | Freq: Every day | NASAL | 6 refills | Status: AC

## 2022-07-11 MED ORDER — NIRMATRELVIR/RITONAVIR (PAXLOVID)TABLET: 3.0000 | ORAL_TABLET | Freq: Two times a day (BID) | ORAL | 0 refills | Status: AC

## 2022-07-11 NOTE — Progress Notes (Signed)
   Virtual Visit  Note Due to COVID-19 pandemic this visit was conducted virtually. This visit type was conducted due to national recommendations for restrictions regarding the COVID-19 Pandemic (e.g. social distancing, sheltering in place) in an effort to limit this patient's exposure and mitigate transmission in our community. All issues noted in this document were discussed and addressed.  A physical exam was not performed with this format.  I connected with Christopher Gilmore on 07/11/22 at 12:10 PM by telephone and verified that I am speaking with the correct person using two identifiers. Christopher Gilmore is currently located at home during visit. The provider, Daryll Drown, NP is located in their office at time of visit.  I discussed the limitations, risks, security and privacy concerns of performing an evaluation and management service by telephone and the availability of in person appointments. I also discussed with the patient that there may be a patient responsible charge related to this service. The patient expressed understanding and agreed to proceed.   History and Present Illness:  URI  This is a new problem. The current episode started yesterday. The problem has been gradually worsening. There has been no fever. Associated symptoms include congestion, coughing, headaches, rhinorrhea and sinus pain. Pertinent negatives include no ear pain or rash. He has tried nothing for the symptoms.      Review of Systems  Constitutional:  Positive for malaise/fatigue.  HENT:  Positive for congestion, rhinorrhea and sinus pain. Negative for ear pain.   Respiratory:  Positive for cough.   Skin:  Negative for itching and rash.  Neurological:  Positive for headaches.  All other systems reviewed and are negative.    Observations/Objective: Televisit patient not in distress.  Assessment and Plan: Patient positive for COVID-19 virus. Education provided to patient for the phone for Paxlovid  antiviral.  Patient verbalized understanding.  CKD-EPI Equations for Glomerular Filtration Rate (GFR) from [email or web address].za  on 07/11/2022  All calculations should be rechecked by clinician prior to use **  RESULT SUMMARY: 76 ml/min/1.73 m Estimated GFR by 2021 CKD-EPI Creatinine  Stage II  CKD stage by CKD-EPI Creatinine   INPUTS: Equation --> 3 = 2021 CKD-EPI Creatinine Sex --> 1 = Male Age --> 56 years Serum creatinine --> 1.14 mg/dL   Follow Up Instructions: Follow-up with worsening unresolved symptoms.    I discussed the assessment and treatment plan with the patient. The patient was provided an opportunity to ask questions and all were answered. The patient agreed with the plan and demonstrated an understanding of the instructions.   The patient was advised to call back or seek an in-person evaluation if the symptoms worsen or if the condition fails to improve as anticipated.  The above assessment and management plan was discussed with the patient. The patient verbalized understanding of and has agreed to the management plan. Patient is aware to call the clinic if symptoms persist or worsen. Patient is aware when to return to the clinic for a follow-up visit. Patient educated on when it is appropriate to go to the emergency department.   Time call ended: 12:19 PM  I provided 9 minutes of  non face-to-face time during this encounter.    Daryll Drown, NP

## 2022-07-11 NOTE — Telephone Encounter (Signed)
[facility] unable to fill  nirmatrelvir/ritonavir EUA (PAXLOVID) 20 x 150 MG & 10 x 100MG  TABS   Needs to be sent to a different pharmacy.

## 2022-07-11 NOTE — Telephone Encounter (Signed)
Paxlovid sent to [facility] . Pt made aware.

## 2022-07-11 NOTE — Patient Instructions (Signed)

## 2022-07-11 NOTE — Telephone Encounter (Signed)
Pt would like for Rx to be sent to [facility] in [place]. Rx sent.

## 2022-10-22 ENCOUNTER — Encounter: Payer: BC Managed Care – PPO | Admitting: Family Medicine

## 2023-01-11 DIAGNOSIS — S6991XA Unspecified injury of right wrist, hand and finger(s), initial encounter: Secondary | ICD-10-CM | POA: Diagnosis not present

## 2023-01-11 DIAGNOSIS — W230XXA Caught, crushed, jammed, or pinched between moving objects, initial encounter: Secondary | ICD-10-CM | POA: Diagnosis not present

## 2023-01-11 DIAGNOSIS — S62662A Nondisplaced fracture of distal phalanx of right middle finger, initial encounter for closed fracture: Secondary | ICD-10-CM | POA: Diagnosis not present

## 2023-01-11 DIAGNOSIS — S62639B Displaced fracture of distal phalanx of unspecified finger, initial encounter for open fracture: Secondary | ICD-10-CM | POA: Diagnosis not present

## 2023-01-14 DIAGNOSIS — S62606A Fracture of unspecified phalanx of right little finger, initial encounter for closed fracture: Secondary | ICD-10-CM | POA: Diagnosis not present

## 2023-01-21 DIAGNOSIS — S62606D Fracture of unspecified phalanx of right little finger, subsequent encounter for fracture with routine healing: Secondary | ICD-10-CM | POA: Diagnosis not present

## 2023-01-21 DIAGNOSIS — S61216A Laceration without foreign body of right little finger without damage to nail, initial encounter: Secondary | ICD-10-CM | POA: Diagnosis not present

## 2023-06-17 ENCOUNTER — Telehealth (INDEPENDENT_AMBULATORY_CARE_PROVIDER_SITE_OTHER): Payer: BC Managed Care – PPO | Admitting: Nurse Practitioner

## 2023-06-17 ENCOUNTER — Encounter: Payer: Self-pay | Admitting: Nurse Practitioner

## 2023-06-17 ENCOUNTER — Telehealth: Payer: Self-pay | Admitting: Family Medicine

## 2023-06-17 DIAGNOSIS — M109 Gout, unspecified: Secondary | ICD-10-CM | POA: Diagnosis not present

## 2023-06-17 MED ORDER — COLCHICINE 0.6 MG PO CAPS: 0.6000 mg | ORAL_CAPSULE | Freq: Two times a day (BID) | ORAL | 0 refills | Status: DC | PRN

## 2023-06-17 NOTE — Telephone Encounter (Signed)
Spoke with pt virtual appt made

## 2023-06-17 NOTE — Progress Notes (Signed)
Virtual Visit via  Note Due to COVID-19 pandemic this visit was conducted virtually. This visit type was conducted due to national recommendations for restrictions regarding the COVID-19 Pandemic (e.g. social distancing, sheltering in place) in an effort to limit this patient's exposure and mitigate transmission in our community. All issues noted in this document were discussed and addressed.  A physical exam was not performed with this format.   I connected with Christopher Gilmore on 06/17/2023 at 1420 by name and DOB and verified that I am speaking with the correct person using two identifiers. Christopher Gilmore is currently located in his office during visit. The provider, Christopher Sinner, DNP is located at home at the time of visit.  I discussed the limitations, risks, security and privacy concerns of performing an evaluation and management service by virtual visit and the availability of in person appointments. I also discussed with the patient that there may be a patient responsible charge related to this service. The patient expressed understanding and agreed to proceed.  Subjective:  Patient ID: Christopher Gilmore, male    DOB: 1966-07-17, 57 y.o.   MRN: [id number]  Chief Complaint:  Gout (Flared up)   HPI: Christopher Gilmore is a 57 y.o. male presenting via telehealth on 06/17/2023 for Gout (Flared up) Acute pain and swelling in the right big toe. The patient reports that the symptoms began approximately 5-days ago, with the onset of sudden, severe pain in the affected joint. The pain is described as a sharp, throbbing sensation and is worsened by touch or movement. The patient notes that the pain is severe at its peak, reaching 7/10  and is associated with redness and swelling of the toe. The pain is worse at night and improves with rest. The patient denies any trauma to the toe or recent injury. There is no history of fever or chills, and the patient has not noticed any rash or other systemic  symptoms. The toe is warm to the touch, but the patient reports no signs of pus or drainage.  The patient states that the last time they had a similar flare-up was years ago, and they have not required ongoing treatment for gout since then. The patient does not currently take any maintenance medication for gout and has not been prescribed any medications for chronic management of uric acid levels.  There is no history of recent dietary indiscretions, excessive alcohol consumption, or other triggers that they are aware of. The patient has a history of hypertension and dyslipidemia but denies any history of kidney disease or joint deformities.   Relevant past medical, surgical, family, and social history reviewed and updated as indicated.  Allergies and medications reviewed and updated.   Past Medical History:  Diagnosis Date   Gout     History reviewed. No pertinent surgical history.  Social History   Socioeconomic History   Marital status: Married    Spouse name: Not on file   Number of children: Not on file   Years of education: Not on file   Highest education level: Not on file  Occupational History   Not on file  Tobacco Use   Smoking status: Never   Smokeless tobacco: Current    Types: Snuff  Substance and Sexual Activity   Alcohol use: Yes    Comment: very rare   Drug use: No   Sexual activity: Not on file  Other Topics Concern   Not on file  Social History Narrative  Not on file   Social Determinants of Health   Financial Resource Strain: Not on file  Food Insecurity: Not on file  Transportation Needs: Not on file  Physical Activity: Not on file  Stress: Not on file  Social Connections: Not on file  Intimate Partner Violence: Not on file    Outpatient Encounter Medications as of 06/17/2023  Medication Sig   Colchicine 0.6 MG CAPS Take 1 capsule (0.6 mg total) by mouth 2 (two) times daily as needed (Gout).   amLODipine (NORVASC) 10 MG tablet Take 1 tablet  (10 mg total) by mouth daily.   fluticasone (FLONASE) 50 MCG/ACT nasal spray Place 2 sprays into both nostrils daily.   guaiFENesin (MUCINEX) 600 MG 12 hr tablet Take 1 tablet (600 mg total) by mouth 2 (two) times daily.   rosuvastatin (CRESTOR) 5 MG tablet Take 1 tablet (5 mg total) by mouth at bedtime.   No facility-administered encounter medications on file as of 06/17/2023.    No Known Allergies  Review of Systems       Observations/Objective: No vital signs or physical exam, this was a virtual health encounter.  Pt alert and oriented, answers all questions appropriately, and able to speak in full sentences.    Assessment and Plan: [name] was seen today for gout.  Diagnoses and all orders for this visit:  Acute gout involving toe, unspecified cause, unspecified laterality -     Colchicine 0.6 MG CAPS; Take 1 capsule (0.6 mg total) by mouth 2 (two) times daily as needed (Gout).   [name] is a 57 year old Caucasian male, no acute distress Gout: Colchicine 0.6 mg; Take 1.2 mg (two 0.6 mg tablets) initially Can take an extra 0.6 mg if symptoms improve or side effects become intolerable.  The total dose should not exceed 1.8 mg over one hour to avoid toxicity. Lab: Uric acid, client instructed to make a lab appointment Avoid purine food  The nature of gout is fully explained, including dietary relationship, acute and interval phase and treatment of both. Long term complications such as kidney stones, tophi and arthritis are discussed. Avoidance of alcohol recommended, and written literature is given along with a low purine diet. Indications for the use of allopurinol for prophylaxis and the use of colchicine to prevent or treat flare-ups is also discussed. Proper use of indomethacin for acute attacks discussed, and its side effects. Call if further attacks occur, or this one does not resolve promptly.   Follow Up Instructions: Return if symptoms worsen or fail to improve.    I  discussed the assessment and treatment plan with the patient. The patient was provided an opportunity to ask questions and all were answered. The patient agreed with the plan and demonstrated an understanding of the instructions.   The patient was advised to call back or seek an in-person evaluation if the symptoms worsen or if the condition fails to improve as anticipated.  The above assessment and management plan was discussed with the patient. The patient verbalized understanding of and has agreed to the management plan. Patient is aware to call the clinic if they develop any new symptoms or if symptoms persist or worsen. Patient is aware when to return to the clinic for a follow-up visit. Patient educated on when it is appropriate to go to the emergency department.    I provided 12 minutes of time during this video encounter.   Christopher Gilmore, [facility] [address] [place], [place]  [address] [phone] 06/17/2023  

## 2023-06-17 NOTE — Telephone Encounter (Signed)
Copied from CRM [id number]. Topic: General - Other >> Jun 17, 2023 12:39 PM Christopher Gilmore wrote: Reason for CRM: Gout slid up   needing virtual visit

## 2023-07-03 ENCOUNTER — Encounter: Payer: Self-pay | Admitting: Family Medicine

## 2023-07-03 MED ORDER — INDOMETHACIN 50 MG PO CAPS: 50.0000 mg | ORAL_CAPSULE | Freq: Two times a day (BID) | ORAL | 1 refills | Status: DC

## 2023-08-10 ENCOUNTER — Other Ambulatory Visit: Payer: Self-pay | Admitting: Family Medicine

## 2023-08-10 DIAGNOSIS — I10 Essential (primary) hypertension: Secondary | ICD-10-CM

## 2023-08-10 DIAGNOSIS — E785 Hyperlipidemia, unspecified: Secondary | ICD-10-CM

## 2023-10-10 ENCOUNTER — Ambulatory Visit: Payer: BC Managed Care – PPO | Admitting: Family Medicine

## 2023-10-10 VITALS — BP 130/81 | HR 76 | Ht 69.0 in | Wt 236.0 lb

## 2023-10-10 DIAGNOSIS — E785 Hyperlipidemia, unspecified: Secondary | ICD-10-CM | POA: Diagnosis not present

## 2023-10-10 DIAGNOSIS — Z125 Encounter for screening for malignant neoplasm of prostate: Secondary | ICD-10-CM | POA: Diagnosis not present

## 2023-10-10 DIAGNOSIS — Z1211 Encounter for screening for malignant neoplasm of colon: Secondary | ICD-10-CM | POA: Diagnosis not present

## 2023-10-10 DIAGNOSIS — M109 Gout, unspecified: Secondary | ICD-10-CM

## 2023-10-10 DIAGNOSIS — I10 Essential (primary) hypertension: Secondary | ICD-10-CM | POA: Diagnosis not present

## 2023-10-10 MED ORDER — AMLODIPINE BESYLATE 10 MG PO TABS
10.0000 mg | ORAL_TABLET | Freq: Every day | ORAL | 3 refills | Status: AC
Start: 2023-10-10 — End: ?

## 2023-10-10 MED ORDER — COLCHICINE 0.6 MG PO CAPS: 0.6000 mg | ORAL_CAPSULE | Freq: Two times a day (BID) | ORAL | 2 refills | Status: AC | PRN

## 2023-10-10 MED ORDER — INDOMETHACIN 50 MG PO CAPS: 50.0000 mg | ORAL_CAPSULE | Freq: Two times a day (BID) | ORAL | 1 refills | Status: AC

## 2023-10-10 MED ORDER — ROSUVASTATIN CALCIUM 5 MG PO TABS
5.0000 mg | ORAL_TABLET | Freq: Every day | ORAL | 3 refills | Status: AC
Start: 2023-10-10 — End: ?

## 2023-10-10 NOTE — Progress Notes (Signed)
 BP 130/81   Pulse 76   Ht 5\' 9"  (1.753 m)   Wt 236 lb (107 kg)   SpO2 98%   BMI 34.85 kg/m    Subjective:   Patient ID: Christopher Gilmore, male    DOB: [birth year], 58 y.o.   MRN: [id number]  HPI: [name] is a 58 y.o. male presenting on 10/10/2023 for Medical Management of Chronic Issues, Hyperlipidemia, and Hypertension   HPI Hypertension Patient is currently on amlodipine, and their blood pressure today is 130/81. Patient denies any lightheadedness or dizziness. Patient denies headaches, blurred vision, chest pains, shortness of breath, or weakness. Denies any side effects from medication and is content with current medication.   Hyperlipidemia Patient is coming in for recheck of his hyperlipidemia. The patient is currently taking Crestor. They deny any issues with myalgias or history of liver damage from it. They deny any focal numbness or weakness or chest pain.   Gout Last attack: 3 months ago Attacks this year: 1 Medication: Indomethacin and colchicine as needed Location of attacks: Right foot  Relevant past medical, surgical, family and social history reviewed and updated as indicated. Interim medical history since our last visit reviewed. Allergies and medications reviewed and updated.  Review of Systems  Constitutional:  Negative for chills and fever.  HENT:  Negative for ear pain and tinnitus.   Eyes:  Negative for pain and visual disturbance.  Respiratory:  Negative for cough, shortness of breath and wheezing.   Cardiovascular:  Negative for chest pain, palpitations and leg swelling.  Gastrointestinal:  Negative for abdominal pain, blood in stool, constipation and diarrhea.  Genitourinary:  Negative for dysuria and hematuria.  Musculoskeletal:  Negative for back pain, gait problem and myalgias.  Skin:  Negative for rash.  Neurological:  Negative for dizziness, weakness and headaches.  Psychiatric/Behavioral:  Negative for suicidal ideas.   All other systems  reviewed and are negative.   Per HPI unless specifically indicated above   Allergies as of 10/10/2023   No Known Allergies      Medication List        Accurate as of October 10, 2023  3:12 PM. If you have any questions, ask your nurse or doctor.          amLODipine 10 MG tablet Commonly known as: NORVASC Take 1 tablet (10 mg total) by mouth daily.   Colchicine 0.6 MG Caps Take 1 capsule (0.6 mg total) by mouth 2 (two) times daily as needed (Gout).   fluticasone 50 MCG/ACT nasal spray Commonly known as: FLONASE Place 2 sprays into both nostrils daily.   guaiFENesin 600 MG 12 hr tablet Commonly known as: Mucinex Take 1 tablet (600 mg total) by mouth 2 (two) times daily.   indomethacin 50 MG capsule Commonly known as: INDOCIN Take 1 capsule (50 mg total) by mouth 2 (two) times daily with a meal.   rosuvastatin 5 MG tablet Commonly known as: CRESTOR Take 1 tablet (5 mg total) by mouth at bedtime.         Objective:   BP 130/81   Pulse 76   Ht 5\' 9"  (1.753 m)   Wt 236 lb (107 kg)   SpO2 98%   BMI 34.85 kg/m   Wt Readings from Last 3 Encounters:  10/10/23 236 lb (107 kg)  04/23/22 229 lb (103.9 kg)  03/22/22 225 lb (102.1 kg)    Physical Exam Vitals and nursing note reviewed.  Constitutional:  General: He is not in acute distress.    Appearance: He is well-developed. He is not diaphoretic.  Eyes:     General: No scleral icterus.       Right eye: No discharge.     Conjunctiva/sclera: Conjunctivae normal.     Pupils: Pupils are equal, round, and reactive to light.  Neck:     Thyroid: No thyromegaly.  Cardiovascular:     Rate and Rhythm: Normal rate and regular rhythm.     Heart sounds: Normal heart sounds. No murmur heard. Pulmonary:     Effort: Pulmonary effort is normal. No respiratory distress.     Breath sounds: Normal breath sounds. No wheezing.  Musculoskeletal:        General: Normal range of motion.     Cervical back: Neck supple.   Lymphadenopathy:     Cervical: No cervical adenopathy.  Skin:    General: Skin is warm and dry.     Findings: No rash.  Neurological:     Mental Status: He is alert and oriented to person, place, and time.     Coordination: Coordination normal.  Psychiatric:        Behavior: Behavior normal.       Assessment & Plan:   Problem List Items Addressed This Visit       Cardiovascular and Mediastinum   Hypertension - Primary   Relevant Medications   amLODipine (NORVASC) 10 MG tablet   rosuvastatin (CRESTOR) 5 MG tablet   Other Relevant Orders   CBC with Differential/Platelet   CMP14+EGFR     Musculoskeletal and Integument   Acute gout involving toe   Relevant Medications   Colchicine 0.6 MG CAPS   indomethacin (INDOCIN) 50 MG capsule   Other Relevant Orders   CBC with Differential/Platelet     Other   Dyslipidemia   Relevant Medications   rosuvastatin (CRESTOR) 5 MG tablet   Other Relevant Orders   Lipid panel   Other Visit Diagnoses       Colon cancer screening       Relevant Orders   Cologuard     Prostate cancer screening       Relevant Orders   PSA, total and free     Will check blood work today, blood pressure and everything else looks good today.  Follow up plan: Return in about 6 months (around 04/11/2024), or if symptoms worsen or fail to improve, for Hypertension and cholesterol recheck.  Counseling provided for all of the vaccine components Orders Placed This Encounter  Procedures   Cologuard   CBC with Differential/Platelet   CMP14+EGFR   Lipid panel   PSA, total and free    Arville Care, MD [place] [facility] 10/10/2023, 3:12 PM

## 2023-10-11 LAB — CMP14+EGFR
ALT: 46 IU/L — ABNORMAL HIGH (ref 0–44)
AST: 68 IU/L — ABNORMAL HIGH (ref 0–40)
Albumin: 4.9 g/dL (ref 3.8–4.9)
Alkaline Phosphatase: 48 IU/L (ref 44–121)
BUN/Creatinine Ratio: 13 (ref 9–20)
BUN: 15 mg/dL (ref 6–24)
Bilirubin Total: 0.3 mg/dL (ref 0.0–1.2)
CO2: 23 mmol/L (ref 20–29)
Calcium: 9.9 mg/dL (ref 8.7–10.2)
Chloride: 101 mmol/L (ref 96–106)
Creatinine, Ser: 1.18 mg/dL (ref 0.76–1.27)
Globulin, Total: 3.3 g/dL (ref 1.5–4.5)
Glucose: 91 mg/dL (ref 70–99)
Potassium: 3.9 mmol/L (ref 3.5–5.2)
Sodium: 139 mmol/L (ref 134–144)
Total Protein: 8.2 g/dL (ref 6.0–8.5)
eGFR: 73 mL/min/{1.73_m2} (ref >59)

## 2023-10-11 LAB — CBC WITH DIFFERENTIAL/PLATELET
Basophils Absolute: 0.1 10*3/uL (ref 0.0–0.2)
Basos: 1 %
EOS (ABSOLUTE): 0.4 10*3/uL (ref 0.0–0.4)
Eos: 4 %
Hematocrit: 47.5 % (ref 37.5–51.0)
Hemoglobin: 16.3 g/dL (ref 13.0–17.7)
Immature Grans (Abs): 0 10*3/uL (ref 0.0–0.1)
Immature Granulocytes: 0 %
Lymphocytes Absolute: 2.6 10*3/uL (ref 0.7–3.1)
Lymphs: 28 %
MCH: 29.5 pg (ref 26.6–33.0)
MCHC: 34.3 g/dL (ref 31.5–35.7)
MCV: 86 fL (ref 79–97)
Monocytes Absolute: 0.7 10*3/uL (ref 0.1–0.9)
Monocytes: 8 %
Neutrophils Absolute: 5.6 10*3/uL (ref 1.4–7.0)
Neutrophils: 59 %
Platelets: 275 10*3/uL (ref 150–450)
RBC: 5.52 x10E6/uL (ref 4.14–5.80)
RDW: 12.9 % (ref 11.6–15.4)
WBC: 9.3 10*3/uL (ref 3.4–10.8)

## 2023-10-11 LAB — LIPID PANEL
Chol/HDL Ratio: 4.1 ratio (ref 0.0–5.0)
Cholesterol, Total: 128 mg/dL (ref 100–199)
HDL: 31 mg/dL — ABNORMAL LOW (ref >39)
LDL Chol Calc (NIH): 49 mg/dL (ref 0–99)
Triglycerides: 309 mg/dL — ABNORMAL HIGH (ref 0–149)
VLDL Cholesterol Cal: 48 mg/dL — ABNORMAL HIGH (ref 5–40)

## 2023-10-11 LAB — PSA, TOTAL AND FREE
PSA, Free Pct: 45.7 %
PSA, Free: 0.32 ng/mL
Prostate Specific Ag, Serum: 0.7 ng/mL (ref 0.0–4.0)

## 2023-10-14 ENCOUNTER — Encounter: Payer: Self-pay | Admitting: Family Medicine

## 2024-04-13 ENCOUNTER — Ambulatory Visit: Admitting: Family Medicine

## 2024-04-14 ENCOUNTER — Encounter: Payer: Self-pay | Admitting: Family Medicine

## 2024-05-06 ENCOUNTER — Ambulatory Visit: Admitting: Family Medicine

## 2024-06-01 ENCOUNTER — Ambulatory Visit: Admitting: Family Medicine

## 2024-06-24 ENCOUNTER — Ambulatory Visit: Payer: Self-pay | Admitting: Family Medicine

## 2024-06-29 ENCOUNTER — Ambulatory Visit: Payer: Self-pay | Admitting: Family Medicine

## 2024-06-29 ENCOUNTER — Encounter: Payer: Self-pay | Admitting: Family Medicine

## 2024-08-19 ENCOUNTER — Ambulatory Visit: Payer: Self-pay | Admitting: Family Medicine
